# Patient Record
Sex: Female | Born: 1955
Health system: Southern US, Community
[De-identification: ages and names within clinical notes are randomized; demographics above are authoritative.]

## PROBLEM LIST (undated history)

## (undated) DIAGNOSIS — K635 Polyp of colon: Secondary | ICD-10-CM

## (undated) DIAGNOSIS — E785 Hyperlipidemia, unspecified: Secondary | ICD-10-CM

## (undated) DIAGNOSIS — M1990 Unspecified osteoarthritis, unspecified site: Secondary | ICD-10-CM

## (undated) DIAGNOSIS — E559 Vitamin D deficiency, unspecified: Secondary | ICD-10-CM

## (undated) DIAGNOSIS — H409 Unspecified glaucoma: Secondary | ICD-10-CM

## (undated) DIAGNOSIS — Z85820 Personal history of malignant melanoma of skin: Secondary | ICD-10-CM

## (undated) DIAGNOSIS — F419 Anxiety disorder, unspecified: Secondary | ICD-10-CM

## (undated) HISTORY — PX: GALLBLADDER SURGERY: SHX652

## (undated) HISTORY — DX: Personal history of malignant melanoma of skin: Z85.820

## (undated) HISTORY — DX: Anxiety disorder, unspecified: F41.9

## (undated) HISTORY — PX: PARTIAL HYSTERECTOMY: SHX80

## (undated) HISTORY — DX: Hyperlipidemia, unspecified: E78.5

## (undated) HISTORY — PX: APPENDECTOMY: SHX54

## (undated) HISTORY — PX: SKIN CANCER EXCISION: SHX779

## (undated) HISTORY — PX: HERNIA REPAIR: SHX51

## (undated) HISTORY — DX: Polyp of colon: K63.5

## (undated) HISTORY — DX: Vitamin D deficiency, unspecified: E55.9

## (undated) HISTORY — DX: Unspecified glaucoma: H40.9

## (undated) HISTORY — PX: CHOLECYSTECTOMY: SHX55

---

## 1998-08-20 ENCOUNTER — Other Ambulatory Visit: Admission: RE | Admit: 1998-08-20 | Discharge: 1998-08-20 | Payer: Self-pay | Admitting: Obstetrics & Gynecology

## 2000-10-25 ENCOUNTER — Other Ambulatory Visit: Admission: RE | Admit: 2000-10-25 | Discharge: 2000-10-25 | Payer: Self-pay | Admitting: Obstetrics & Gynecology

## 2001-02-22 ENCOUNTER — Emergency Department (HOSPITAL_COMMUNITY): Admission: EM | Admit: 2001-02-22 | Discharge: 2001-02-22 | Payer: Self-pay | Admitting: Emergency Medicine

## 2001-11-22 ENCOUNTER — Other Ambulatory Visit: Admission: RE | Admit: 2001-11-22 | Discharge: 2001-11-22 | Payer: Self-pay | Admitting: Obstetrics & Gynecology

## 2002-12-08 ENCOUNTER — Other Ambulatory Visit: Admission: RE | Admit: 2002-12-08 | Discharge: 2002-12-08 | Payer: Self-pay | Admitting: Obstetrics & Gynecology

## 2004-01-21 ENCOUNTER — Other Ambulatory Visit: Admission: RE | Admit: 2004-01-21 | Discharge: 2004-01-21 | Payer: Self-pay | Admitting: Obstetrics & Gynecology

## 2005-02-23 ENCOUNTER — Other Ambulatory Visit: Admission: RE | Admit: 2005-02-23 | Discharge: 2005-02-23 | Payer: Self-pay | Admitting: Obstetrics & Gynecology

## 2005-03-16 ENCOUNTER — Ambulatory Visit: Payer: Self-pay | Admitting: Gastroenterology

## 2005-03-23 ENCOUNTER — Ambulatory Visit: Payer: Self-pay | Admitting: Gastroenterology

## 2005-04-27 ENCOUNTER — Ambulatory Visit: Payer: Self-pay | Admitting: Gastroenterology

## 2009-08-05 ENCOUNTER — Encounter (INDEPENDENT_AMBULATORY_CARE_PROVIDER_SITE_OTHER): Payer: Self-pay | Admitting: *Deleted

## 2009-09-27 ENCOUNTER — Ambulatory Visit: Payer: Self-pay | Admitting: Gastroenterology

## 2009-09-27 DIAGNOSIS — Z860100 Personal history of colon polyps, unspecified: Secondary | ICD-10-CM | POA: Insufficient documentation

## 2009-09-27 DIAGNOSIS — H402290 Chronic angle-closure glaucoma, unspecified eye, stage unspecified: Secondary | ICD-10-CM | POA: Insufficient documentation

## 2009-09-27 DIAGNOSIS — K625 Hemorrhage of anus and rectum: Secondary | ICD-10-CM | POA: Insufficient documentation

## 2010-02-03 NOTE — Letter (Signed)
Summary: New Patient letter  [facility]  [address] [place], [place] [address]   Phone: [phone]  Fax: [phone]       08/05/2009 MRN: [id number]  [facility] [address] [place], [place]  [address]  Dear Ms. Lutzke,  Welcome to the Gastroenterology Division at [facility].    You are scheduled to see Dr. Arlyce Dice on 09/27/2009 at 9:00AM on the 3rd floor at [facility], [address]  We ask that you try to arrive at our office 15 minutes prior to your appointment time to allow for check-in.  We would like you to complete the enclosed self-administered evaluation form prior to your visit and bring it with you on the day of your appointment.  We will review it with you.  Also, please bring a complete list of all your medications or, if you prefer, bring the medication bottles and we will list them.  Please bring your insurance card so that we may make a copy of it.  If your insurance requires a referral to see a specialist, please bring your referral form from your primary care physician.  Co-payments are due at the time of your visit and may be paid by cash, check or credit card.     Your office visit will consist of a consult with your physician (includes a physical exam), any laboratory testing he/she may order, scheduling of any necessary diagnostic testing (e.g. x-ray, ultrasound, CT-scan), and scheduling of a procedure (e.g. Endoscopy, Colonoscopy) if required.  Please allow enough time on your schedule to allow for any/all of these possibilities.    If you cannot keep your appointment, please call [phone] to cancel or reschedule prior to your appointment date.  This allows [place] the opportunity to schedule an appointment for another patient in need of care.  If you do not cancel or reschedule by 5 p.m. the business day prior to your appointment date, you will be charged a $50.00 late cancellation/no-show fee.    Thank you for choosing  [facility] for your medical needs.  We appreciate the opportunity to care for you.  Please visit [place] at our website  to learn more about our practice.                     Sincerely,                                                             The Gastroenterology Division

## 2010-02-03 NOTE — Procedures (Signed)
Summary: Colonoscopy   Colonoscopy  Procedure date:  03/23/2005  Findings:      Results: Polyp.  Results: Hemorrhoids.     Location:  [facility].    Comments:      Repeat colonoscopy in 5 years.   Colonoscopy  Procedure date:  03/23/2005  Findings:      Results: Polyp.  Results: Hemorrhoids.     Location:  [facility].    Comments:      Repeat colonoscopy in 5 years.  Patient Name: Angie Fisher, Angie Fisher. MRN:  Procedure Procedures: Colonoscopy CPT: 7801474117.    with polypectomy. CPT: A3573898.  Personnel: Endoscopist: Barbette Hair. Arlyce Dice, MD.  Patient Consent: Procedure, Alternatives, Risks and Benefits discussed, consent obtained, from patient.  Indications Symptoms: Abdominal pain / bloating.  History  Current Medications: Patient is not currently taking Coumadin.  Pre-Exam Physical: Performed Mar 23, 2005. Cardio-pulmonary exam, HEENT exam , Abdominal exam, Mental status exam WNL.  Comments: Patient history reviewed/updated, physical performed prior to initiation of sedation? Exam Exam: Extent of exam reached: Cecum, extent intended: Cecum.  The cecum was identified by IC valve. Colon retroflexion performed. ASA Classification: I. Tolerance: good.  Monitoring: Pulse and BP monitoring, Oximetry used. Supplemental O2 given. at 2 Liters.  Colon Prep Used Miralax for colon prep. Prep results: good.  Sedation Meds: Patient assessed and found to be appropriate for moderate (conscious) sedation. Sedation was managed by the Endoscopist. Fentanyl 100 mcg. given IV. Versed 12 given IV.  Findings - NORMAL EXAM: Cecum to Rectum.  NORMAL EXAM: Cecum.  POLYP: Cecum, Maximum size: 15 mm. sessile polyp. Procedure:  snare with cautery/saline, Polyp sent to pathology. ICD9: Colon Polyps: 211.3.  HEMORRHOIDS: Internal. ICD9: Hemorrhoids, Internal: 455.0.   Assessment Abnormal examination, see findings above.  Diagnoses: 211.3: Colon Polyps.    455.0: Hemorrhoids, Internal.   Events  Unplanned Interventions: No intervention was required.  Unplanned Events: There were no complications. Plans  Post Exam Instructions: Post sedation instructions given.  Medication Plan: Fiber supplements: Bran 1 Tbsp QD, starting Mar 23, 2005   Patient Education: Patient given standard instructions for: Polyps. Hemorrhoids.  Disposition: After procedure patient sent to recovery. After recovery patient sent home.  Scheduling/Referral: Office Visit, to [employer]. Arlyce Dice, MD, around Apr 23, 2005.  Colonoscopy, to Barbette Hair. Arlyce Dice, MD, around Mar 24, 2010.    This report was created from the original endoscopy report, which was reviewed and signed by the above listed endoscopist.

## 2010-02-03 NOTE — Assessment & Plan Note (Signed)
Summary: RECTAL BLEEDING...AS.   History of Present Illness Visit Type: consult Primary GI MD: Melvia Heaps MD [facility] Primary Rykar Lebleu: Catha Gosselin, MD  Requesting Burgundy Matuszak: Freddy Finner, MD  Chief Complaint: Rectal pain, hemorrhoids, and rectal bleeding History of Present Illness:   [name] is a pleasant 55 year old white female referred at the request of Dr. Jennette Kettle for evaluation of bleeding.  She has a history of colon polyps and hemorrhoids seen at colonoscopy in 2008.  Several weeks ago she had a week of painless bright red blood per rectum.  She occasionally developes spontaneous rectal pain.  She complains of mild constipation.   GI Review of Systems      Denies abdominal pain, acid reflux, belching, bloating, chest pain, dysphagia with liquids, dysphagia with solids, heartburn, loss of appetite, nausea, vomiting, vomiting blood, weight loss, and  weight gain.      Reports hemorrhoids, rectal bleeding, and  rectal pain.     Denies anal fissure, black tarry stools, change in bowel habit, constipation, diarrhea, diverticulosis, fecal incontinence, heme positive stool, irritable bowel syndrome, jaundice, light color stool, and  liver problems.    Current Medications (verified): 1)  Estradiol 2 Mg Tabs (Estradiol) .... As Directed 2)  Simvastatin 40 Mg Tabs (Simvastatin) .... One Tablet By Mouth Once Daily 3)  Lumigan 0.03 % Soln (Bimatoprost) .... As Directed 4)  Preparation H Hydrocortisone 1 % Crea (Hydrocortisone) .... As Needed  Allergies (verified): 1)  ! 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  Alcohol Use - no Daily Caffeine Use: one daily  Illicit Drug Use - no Smoking Status:  quit Drug Use:  no  Review of Systems       The patient complains of muscle pains/cramps.  The patient denies allergy/sinus, anemia, anxiety-new, arthritis/joint pain, back pain, blood in urine, breast changes/lumps, change in vision, confusion, cough, coughing up blood, depression-new, fainting, fatigue, fever, headaches-new, hearing problems, heart murmur, heart rhythm changes, itching, menstrual pain, night sweats, nosebleeds, pregnancy symptoms, shortness of breath, skin rash, sleeping problems, sore throat, swelling of feet/legs, swollen lymph glands, thirst - excessive , urination - excessive , urination changes/pain, urine leakage, vision changes, and voice change.         All other systems were reviewed and were negative   Vital Signs:  Patient profile:   55 year old female Height:      64 inches Weight:      146 pounds BMI:     25.15 BSA:     1.71 Pulse rate:   76 / minute BP sitting:   126 / 82  (left arm) Cuff size:   regular  Vitals Entered By: Ok Anis CMA (September 27, 2009 9:05 AM)  Physical Exam  Additional Exam:  O physical exam she is a well-developed well-nourished female  skin: anicteric HEENT: normocephalic; PEERLA; no nasal or pharyngeal abnormalities neck: supple nodes: no cervical lymphadenopathy chest: clear to ausculatation and percussion heart: no murmurs, gallops, or rubs abd: soft, nontender; BS normoactive; no abdominal masses, tenderness, organomegaly rectal: no masses ext: no cynanosis, clubbing, edema skeletal: no deformities neuro: oriented x 3; no focal abnormalities    Impression &  Recommendations:  Problem # 1:  RECTAL BLEEDING (ICD-569.3) This most likely is due to hemorrhoidal bleeding.  Recommendations #1 Anusol HC suppositories  Problem # 2:  PERSONAL HISTORY OF COLONIC POLYPS (ICD-V12.72) Plan followup colonoscopy in  March, 2012  Patient Instructions: 1)  Copy sent to : Catha Gosselin, MD -Freddy Finner, MD  2)  The medication list was reviewed and reconciled.  All changed / newly prescribed medications were explained.  A complete medication list was provided to the patient / caregiver. Prescriptions: ANUSOL-HC 25 MG SUPP (HYDROCORTISONE ACETATE) take one suppository q.h.s.  #7 x 1   Entered and Authorized by:   Louis Meckel MD   Signed by:   Louis Meckel MD on 09/27/2009   Method used:   [facility]        [facility] [address] [phone]* (retail)       [address]       [place], [place]  [address]       Ph: [phone] or [phone]       Fax: [phone]   RxID:   [id number]

## 2010-03-10 ENCOUNTER — Encounter (INDEPENDENT_AMBULATORY_CARE_PROVIDER_SITE_OTHER): Payer: Self-pay | Admitting: *Deleted

## 2010-03-11 ENCOUNTER — Encounter: Payer: Self-pay | Admitting: Gastroenterology

## 2010-03-15 NOTE — Miscellaneous (Signed)
Summary: [facility] Previsit/prep  Clinical Lists Changes  Medications: Added new medication of MOVIPREP 100 GM  SOLR (PEG-KCL-NACL-NASULF-NA ASC-C) As per prep instructions. - Signed Rx of MOVIPREP 100 GM  SOLR (PEG-KCL-NACL-NASULF-NA ASC-C) As per prep instructions.;  #1 x 0;  Signed;  Entered by: Wyona Almas RN;  Authorized by: Louis Meckel MD;  Method used: [facility] [address] [phone]*, [address] [place], [place], [place]  [address], Ph: [phone] or [phone], Fax: [phone] Allergies: Changed allergy or adverse reaction from SULFA to SULFA    Prescriptions: MOVIPREP 100 GM  SOLR (PEG-KCL-NACL-NASULF-NA ASC-C) As per prep instructions.  #1 x 0   Entered by:   Wyona Almas RN   Authorized by:   Louis Meckel MD   Signed by:   Wyona Almas RN on 03/11/2010   Method used:   [facility]ron[facility]        [facility] [address] [phone]* (retail)       [address]       [place], [place]  [address]       Ph: [phone] or [phone]       Fax: [phone]   RxID:   [id number]

## 2010-03-15 NOTE — Letter (Signed)
Summary: [facility] Instructions  [facility]  [address] [place], [place] [address]   Phone: [phone]  Fax: [phone]       Angie Fisher    [birth year]    MRN: [id number]        Procedure Day Dorna Bloom:  Farrell Ours  03/25/10     Arrival Time:  7:30AM     Procedure Time:  8:30AM     Location of Procedure:                    Juliann Pares _  [facility] (4th Floor)                      PREPARATION FOR COLONOSCOPY WITH MOVIPREP   Starting 5 days prior to your procedure 03/20/10 do not eat nuts, seeds, popcorn, corn, beans, peas,  salads, or any raw vegetables.  Do not take any fiber supplements (e.g. Metamucil, Citrucel, and Benefiber).  THE DAY BEFORE YOUR PROCEDURE         DATE: 03/24/10  DAY: THURSDAY  1.  Drink clear liquids the entire day-NO SOLID FOOD  2.  Do not drink anything colored red or purple.  Avoid juices with pulp.  No orange juice.  3.  Drink at least 64 oz. (8 glasses) of fluid/clear liquids during the day to prevent dehydration and help the prep work efficiently.  CLEAR LIQUIDS INCLUDE: Water Jello Ice Popsicles Tea (sugar ok, no milk/cream) Powdered fruit flavored drinks Coffee (sugar ok, no milk/cream) Gatorade Juice: apple, white grape, white cranberry  Lemonade Clear bullion, consomm, broth Carbonated beverages (any kind) Strained chicken noodle soup Hard Candy                             4.  In the morning, mix first dose of MoviPrep solution:    Empty 1 Pouch A and 1 Pouch B into the disposable container    Add lukewarm drinking water to the top line of the container. Mix to dissolve    Refrigerate (mixed solution should be used within 24 hrs)  5.  Begin drinking the prep at 5:00 p.m. The MoviPrep container is divided by 4 marks.   Every 15 minutes drink the solution down to the next mark (approximately 8 oz) until the full liter is complete.   6.  Follow completed prep with 16 oz of clear liquid of your choice (Nothing red  or purple).  Continue to drink clear liquids until bedtime.  7.  Before going to bed, mix second dose of MoviPrep solution:    Empty 1 Pouch A and 1 Pouch B into the disposable container    Add lukewarm drinking water to the top line of the container. Mix to dissolve    Refrigerate  THE DAY OF YOUR PROCEDURE      DATE: 03/25/10   DAY: FRIDAY  Beginning at 3:30AM (5 hours before procedure):         1. Every 15 minutes, drink the solution down to the next mark (approx 8 oz) until the full liter is complete.  2. Follow completed prep with 16 oz. of clear liquid of your choice.    3. You may drink clear liquids until 6:30AM (2 HOURS BEFORE PROCEDURE).   MEDICATION INSTRUCTIONS  Unless otherwise instructed, you should take regular prescription medications with a small sip of water   as early as possible the morning of  your procedure.        OTHER INSTRUCTIONS  You will need a responsible adult at least 53 years of age to accompany you and drive you home.   This person must remain in the waiting room during your procedure.  Wear loose fitting clothing that is easily removed.  Leave jewelry and other valuables at home.  However, you may wish to bring a book to read or  an iPod/MP3 player to listen to music as you wait for your procedure to start.  Remove all body piercing jewelry and leave at home.  Total time from sign-in until discharge is approximately 2-3 hours.  You should go home directly after your procedure and rest.  You can resume normal activities the  day after your procedure.  The day of your procedure you should not:   Drive   Make legal decisions   Operate machinery   Drink alcohol   Return to work  You will receive specific instructions about eating, activities and medications before you leave.    The above instructions have been reviewed and explained to me by   Wyona Almas RN  March 11, 2010 4:33 PM     I fully understand and can  verbalize these instructions _____________________________ Date _________

## 2010-03-24 ENCOUNTER — Encounter: Payer: Self-pay | Admitting: *Deleted

## 2010-03-25 ENCOUNTER — Encounter: Payer: Self-pay | Admitting: Gastroenterology

## 2010-03-25 ENCOUNTER — Ambulatory Visit (AMBULATORY_SURGERY_CENTER): Payer: 59 | Admitting: Gastroenterology

## 2010-03-25 VITALS — BP 105/75 | HR 71 | Temp 97.2°F | Resp 16 | Ht 64.0 in | Wt 147.0 lb

## 2010-03-25 DIAGNOSIS — Z1211 Encounter for screening for malignant neoplasm of colon: Secondary | ICD-10-CM

## 2010-03-25 DIAGNOSIS — Z86010 Personal history of colonic polyps: Secondary | ICD-10-CM

## 2010-03-25 DIAGNOSIS — D126 Benign neoplasm of colon, unspecified: Secondary | ICD-10-CM

## 2010-03-25 NOTE — Patient Instructions (Signed)
[employer] reviewed and signed by care partner. Polyp information sheet given to care partner.  Impressions/Recommendations: Polyp.  Repeat colonoscopy in 5-10 years pending pathology reports.

## 2010-03-28 ENCOUNTER — Telehealth: Payer: Self-pay | Admitting: *Deleted

## 2010-03-28 NOTE — Telephone Encounter (Signed)
Left message with patient

## 2010-03-29 ENCOUNTER — Encounter: Payer: Self-pay | Admitting: Gastroenterology

## 2010-03-31 NOTE — Procedures (Signed)
Summary: Colonoscopy  Patient: Angie Fisher Note: All result statuses are Final unless otherwise noted.  Tests: (1) Colonoscopy (COL)   COL Colonoscopy           DONE     [facility]     [address]     [place], [place]  [address]          COLONOSCOPY PROCEDURE REPORT          PATIENT:  Shivali, Quackenbush  MR#:  [id number]     BIRTHDATE:  1955-02-17, 54 yrs. old  GENDER:  female          ENDOSCOPIST:  Barbette Hair. Arlyce Dice, MD     Referred by:  Catha Gosselin, M.D.          PROCEDURE DATE:  03/25/2010     PROCEDURE:  Colonoscopy with snare polypectomy     ASA CLASS:  Class I     INDICATIONS:  1) screening  2) history of pre-cancerous     (adenomatous) colon polyps index polypectomy 2006          MEDICATIONS:   Fentanyl 100 mcg IV, Versed 10 mg IM, Benadryl 25     mg IV          DESCRIPTION OF PROCEDURE:   After the risks benefits and     alternatives of the procedure were thoroughly explained, informed     consent was obtained.  Digital rectal exam was performed and     revealed no abnormalities.   The LB [id number] endoscope     was introduced through the anus and advanced to the cecum, which     was identified by the ileocecal valve, without limitations.  The     quality of the prep was excellent, using MoviPrep.  The instrument     was then slowly withdrawn as the colon was fully examined.     <<PROCEDUREIMAGES>>          FINDINGS:  A sessile polyp was found in the ascending colon. It     was 12 mm in size. Polyp was snared without cautery. Retrieval was     successful (see image3). snare polyp  This was otherwise a normal     examination of the colon (see image4 and image6).   Retroflexed     views in the rectum revealed no abnormalities.    The time to     cecum =  5.25  minutes. The scope was then withdrawn (time =  6.0     min) from the patient and the procedure completed.          COMPLICATIONS:  None          ENDOSCOPIC IMPRESSION:     1) 12 mm  sessile polyp in the ascending colon     2) Otherwise normal examination     RECOMMENDATIONS:     1) If the polyp(s) removed today are proven to be adenomatous     (pre-cancerous) polyps, you will need a repeat colonoscopy in 5     years. Otherwise you should continue to follow colorectal cancer     screening guidelines for "routine risk" patients with colonoscopy     in 10 years.     2) Sedation with MAC for future procedures          REPEAT EXAM:   You will receive a letter from Dr. Arlyce Dice in 1-2     weeks, after reviewing the final  pathology, with followup     recommendations.          ______________________________     Barbette Hair Arlyce Dice, MD          CC:          n.     eSIGNED:   Barbette Hair. Trevon Strothers at 03/25/2010 08:48 AM          [place], Olegario Messier, [id number]  Note: An exclamation mark (!) indicates a result that was not dispersed into the flowsheet. Document Creation Date: 03/25/2010 8:48 AM _______________________________________________________________________  (1) Order result status: Final Collection or observation date-time: 03/25/2010 08:39 Requested date-time:  Receipt date-time:  Reported date-time:  Referring Physician:   Ordering Physician: Melvia Heaps [phone]) Specimen Source:  Source: Launa Grill Order Number: [id number] Lab site:

## 2011-08-24 ENCOUNTER — Other Ambulatory Visit: Payer: Self-pay | Admitting: Dermatology

## 2012-09-12 ENCOUNTER — Other Ambulatory Visit: Payer: Self-pay | Admitting: Dermatology

## 2013-09-27 ENCOUNTER — Encounter: Payer: Self-pay | Admitting: Gastroenterology

## 2014-01-05 ENCOUNTER — Other Ambulatory Visit: Payer: Self-pay | Admitting: Obstetrics & Gynecology

## 2014-01-06 LAB — CYTOLOGY - PAP

## 2015-03-19 ENCOUNTER — Encounter: Payer: Self-pay | Admitting: Gastroenterology

## 2016-10-31 ENCOUNTER — Other Ambulatory Visit: Payer: Self-pay | Admitting: Family Medicine

## 2016-10-31 DIAGNOSIS — M5416 Radiculopathy, lumbar region: Secondary | ICD-10-CM

## 2016-11-09 ENCOUNTER — Ambulatory Visit
Admission: RE | Admit: 2016-11-09 | Discharge: 2016-11-09 | Disposition: A | Discharge status: Home / Self Care | Payer: 59 | Source: Ambulatory Visit | Attending: Family Medicine | Admitting: Family Medicine

## 2016-11-09 DIAGNOSIS — M5416 Radiculopathy, lumbar region: Secondary | ICD-10-CM

## 2016-11-17 ENCOUNTER — Other Ambulatory Visit: Payer: Self-pay | Admitting: Family Medicine

## 2016-11-17 DIAGNOSIS — R5381 Other malaise: Secondary | ICD-10-CM

## 2016-11-27 ENCOUNTER — Other Ambulatory Visit: Payer: Self-pay | Admitting: Family Medicine

## 2016-11-27 DIAGNOSIS — R102 Pelvic and perineal pain: Secondary | ICD-10-CM

## 2016-11-27 DIAGNOSIS — M549 Dorsalgia, unspecified: Secondary | ICD-10-CM

## 2016-11-27 DIAGNOSIS — R109 Unspecified abdominal pain: Secondary | ICD-10-CM

## 2016-11-30 ENCOUNTER — Ambulatory Visit
Admission: RE | Admit: 2016-11-30 | Discharge: 2016-11-30 | Disposition: A | Discharge status: Home / Self Care | Payer: 59 | Source: Ambulatory Visit | Attending: Family Medicine | Admitting: Family Medicine

## 2016-11-30 DIAGNOSIS — R102 Pelvic and perineal pain: Secondary | ICD-10-CM

## 2018-05-24 ENCOUNTER — Other Ambulatory Visit: Payer: Self-pay | Admitting: Family Medicine

## 2018-05-24 DIAGNOSIS — M7989 Other specified soft tissue disorders: Secondary | ICD-10-CM

## 2018-05-30 ENCOUNTER — Other Ambulatory Visit: Payer: Self-pay

## 2018-05-30 ENCOUNTER — Ambulatory Visit
Admission: RE | Admit: 2018-05-30 | Discharge: 2018-05-30 | Disposition: A | Discharge status: Home / Self Care | Payer: 59 | Source: Ambulatory Visit | Attending: Family Medicine | Admitting: Family Medicine

## 2018-05-30 DIAGNOSIS — M7989 Other specified soft tissue disorders: Secondary | ICD-10-CM

## 2018-08-27 ENCOUNTER — Other Ambulatory Visit: Payer: Self-pay

## 2018-08-27 DIAGNOSIS — I872 Venous insufficiency (chronic) (peripheral): Secondary | ICD-10-CM

## 2018-08-29 ENCOUNTER — Ambulatory Visit (INDEPENDENT_AMBULATORY_CARE_PROVIDER_SITE_OTHER): Payer: 59 | Admitting: Vascular Surgery

## 2018-08-29 ENCOUNTER — Ambulatory Visit (HOSPITAL_COMMUNITY)
Admission: RE | Admit: 2018-08-29 | Discharge: 2018-08-29 | Disposition: A | Discharge status: Home / Self Care | Payer: 59 | Source: Ambulatory Visit | Attending: Family | Admitting: Family

## 2018-08-29 ENCOUNTER — Other Ambulatory Visit: Payer: Self-pay

## 2018-08-29 ENCOUNTER — Encounter: Payer: Self-pay | Admitting: Vascular Surgery

## 2018-08-29 VITALS — BP 133/78 | HR 76 | Temp 97.9°F | Resp 18 | Ht 64.0 in | Wt 153.8 lb

## 2018-08-29 DIAGNOSIS — I872 Venous insufficiency (chronic) (peripheral): Secondary | ICD-10-CM | POA: Diagnosis not present

## 2018-08-29 DIAGNOSIS — R600 Localized edema: Secondary | ICD-10-CM

## 2018-08-29 NOTE — Progress Notes (Signed)
Referring Physician: Dr Hulan Fess  Patient name: Angie Fisher MRN: [id number] DOB: April 28, 1955 Sex: female  REASON FOR CONSULT: Right ankle and foot swelling  HPI: Angie Fisher is a 63 y.o. female, with a several month history of right foot and ankle swelling.  The patient previously had a skin cancer removed from her right medial calf.  She states that recently she has had some pain and aching underneath the prior scar which radiates down into her foot.  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  That procedure was in 2014.  She has no family history of varicose veins.  She has no family history or personal history of DVT.  She states the leg swelling does improve with elevation.  She has been wearing some TED hose during the day which improved her symptoms a little bit but did not completely alleviate them.  She apparently had a conversation with her primary care physician Dr. Rex Kras recently who suggested may be going to a more compression.  She has no symptoms in the left leg.  Other medical problems include hyperlipidemia which has been stable.  Past Medical History:  Diagnosis Date  . Hyperlipidemia    Past Surgical History:  Procedure Laterality Date  . GALLBLADDER SURGERY    . PARTIAL HYSTERECTOMY    . SKIN CANCER EXCISION      History reviewed. No pertinent family history.  SOCIAL HISTORY: Social History   Socioeconomic History  . Marital status: Married    Spouse name: Not on file  . Number of children: Not on file  . Years of education: Not on file  . Highest education level: Not on file  Occupational History  . Not on file  Social Needs  . Financial resource strain: Not on file  . Food insecurity    Worry: Not on file    Inability: Not on file  . Transportation needs    Medical: Not on file    Non-medical: Not on file  Tobacco Use  . Smoking status: Former Research scientist (life sciences)  . Smokeless tobacco: Never Used  Substance and Sexual Activity  . Alcohol use: Yes   Alcohol/week: 3.0 standard drinks    Types: 3 Standard drinks or equivalent per week  . Drug use: Never  . Sexual activity: Not on file  Lifestyle  . Physical activity    Days per week: Not on file    Minutes per session: Not on file  . Stress: Not on file  Relationships  . Social Herbalist on phone: Not on file    Gets together: Not on file    Attends religious service: Not on file    Active member of club or organization: Not on file    Attends meetings of clubs or organizations: Not on file    Relationship status: Not on file  . Intimate partner violence    Fear of current or ex partner: Not on file    Emotionally abused: Not on file    Physically abused: Not on file    Forced sexual activity: Not on file  Other Topics Concern  . Not on file  Social History Narrative  . Not on file    Allergies  Allergen Reactions  . Lidocaine Hives  . Morphine Other (See Comments)    crazy  . Sulfonamide Derivatives     REACTION: Nausea/vomiting    Current Outpatient Medications  Medication Sig Dispense Refill  . ALPRAZolam (XANAX) 0.25 MG tablet 2 (  two) times daily as needed.     . Cholecalciferol (VITAMIN D3) 50 MCG (2000 UT) TABS Take 2,000 mcg by mouth daily.    Marland Kitchen estradiol (ESTRACE) 2 MG tablet Take 2 mg by mouth as directed.      . simvastatin (ZOCOR) 40 MG tablet Take 40 mg by mouth at bedtime.      . timolol (TIMOPTIC) 0.5 % ophthalmic solution     . triamterene-hydrochlorothiazide (DYAZIDE) 37.5-25 MG capsule triamterene 37.5 mg-hydrochlorothiazide 25 mg capsule    . valACYclovir (VALTREX) 1000 MG tablet TAKE 1/2 TABLET BY MOUTH TWICE A DAY FOR 5 DAYS FOR OUT BREAKS     No current facility-administered medications for this visit.     ROS:   General:  No weight loss, Fever, chills  HEENT: No recent headaches, no nasal bleeding, no visual changes, no sore throat  Neurologic: No dizziness, blackouts, seizures. No recent symptoms of stroke or mini- stroke.  No recent episodes of slurred speech, or temporary blindness.  Cardiac: No recent episodes of chest pain/pressure, no shortness of breath at rest.  No shortness of breath with exertion.  Denies history of atrial fibrillation or irregular heartbeat  Vascular: No history of rest pain in feet.  No history of claudication.  No history of non-healing ulcer, No history of DVT   Pulmonary: No home oxygen, no productive cough, no hemoptysis,  No asthma or wheezing  Musculoskeletal:  [ ]  Arthritis, [ ]  Low back pain,  [ ]  Joint pain  Hematologic:No history of hypercoagulable state.  No history of easy bleeding.  No history of anemia  Gastrointestinal: No hematochezia or melena,  No gastroesophageal reflux, no trouble swallowing  Urinary: [ ]  chronic Kidney disease, [ ]  on HD - [ ]  MWF or [ ]  TTHS, [ ]  Burning with urination, [ ]  Frequent urination, [ ]  Difficulty urinating;   Skin: No rashes  Psychological: No history of anxiety,  No history of depression   Physical Examination  Vitals:   08/29/18 1352  BP: 133/78  Pulse: 76  Resp: 18  Temp: 97.9 F (36.6 C)  SpO2: 97%  Weight: 153 lb 12.8 oz (69.8 kg)  Height: 5\' 4"  (1.626 m)    Body mass index is 26.4 kg/m.  General:  Alert and oriented, no acute distress HEENT: Normal Neck: No JVD Cardiac: Regular Rate and Rhythm  Abdomen: Soft, non-tender, non-distended, no palpable inguinal nodes Skin: No rash, no obvious large surface varicosities Extremity Pulses:  2+ radial, brachial, femoral, popliteal difficult to palpate dorsalis pedis, posterior tibial pulses bilaterally Musculoskeletal: No deformity trace ankle and pedal edema right foot  Neurologic: Upper and lower extremity motor 5/5 and symmetric  DATA:  Patient had a venous duplex for reflux today.  This should show some reflux in her right greater saphenous vein.  However the vein diameter was pretty small 2 mm in diameter or less throughout most of the leg.  There was some  reflux at the saphenofemoral junction.  There was no deep vein reflux.  ASSESSMENT: Patient with right ankle and foot swelling.  Although she has some superficial venous reflux her vein diameter is fairly small and not amenable to laser ablation at that diameter.  I agree with Dr. Rex Kras that she probably would benefit from compression stockings.  I also discussed with the patient that the duplex that we did today did not examine the iliac venous system and that if she has persistent symptoms and is not satisfied with improvement with  compression alone we could consider a vena caval iliac vein duplex for completeness to make sure that she does not have any iliac vein obstruction.   PLAN: She was fitted for lower extremity compression stocking for the right leg today.  If her symptoms or not improved or she is not satisfied she will return for a vena cava iliac vein duplex scan.  Otherwise she will follow-up on an as-needed basis.  As far as the reflux in her right greater saphenous is concerned.  This may eventually dilate over the next several years.  If it does we could certainly revisit whether or not the laser ablation would be in her interest.  I certainly would not consider redoing her duplex of her right leg unless she has renewed or worsening symptoms for at least several years.  Ruta Hinds, MD [facility] Office: [phone] Pager: [phone]

## 2019-03-13 IMAGING — MR MR LUMBAR SPINE W/O CM
4 of 5 series · 19 of 48 positions shown · non-contrast
Comparison: Lumbar spine radiographs 05/19/2015

CLINICAL DATA: Right low back pain.  Fall 3.5 weeks ago.

EXAM:
MRI LUMBAR SPINE WITHOUT CONTRAST
TECHNIQUE: Multiplanar, multisequence MR imaging of the lumbar spine was
performed. No intravenous contrast was administered.

[Series 6: T2 · sagittal · 4.0mm · 0.73mm/px · 6 of 15 slices shown (1 of 2)]
[im 1/15]
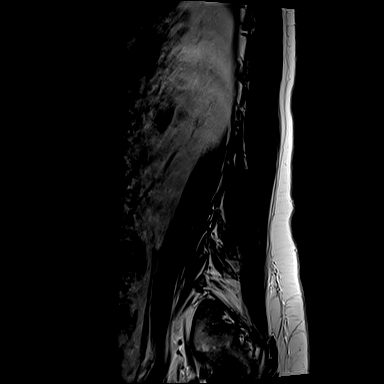
[im 3/15]
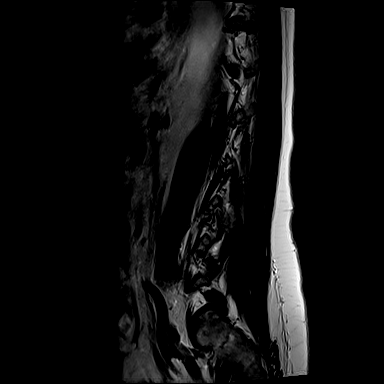
[im 6/15]
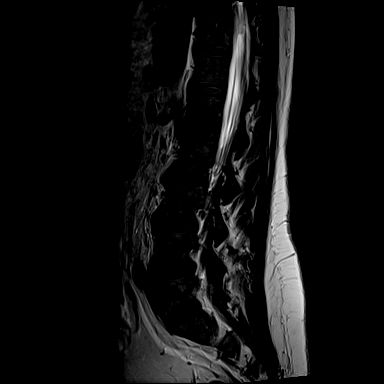
[im 9/15]
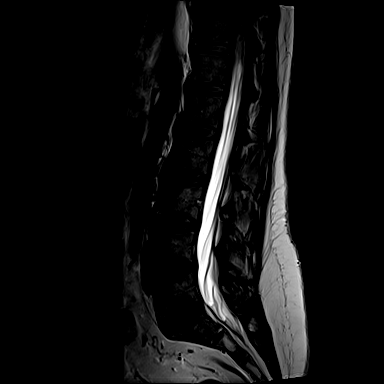
[im 12/15]
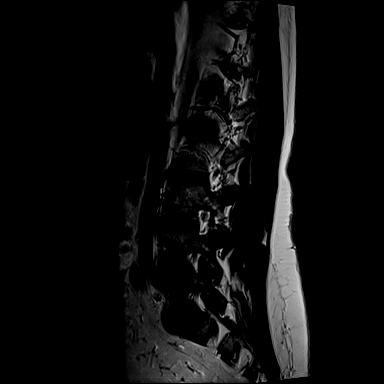
[im 15/15]
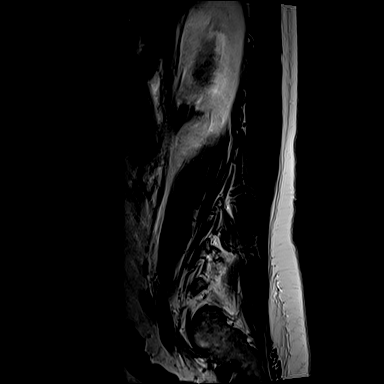

[Series 8: T1 · sagittal · 4.0mm · 0.73mm/px · 3 of 15 slices shown (1 of 2)]
[im 1/15]
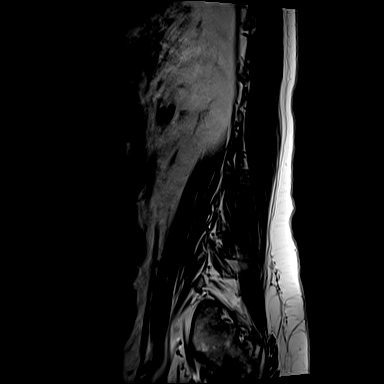
[im 8/15]
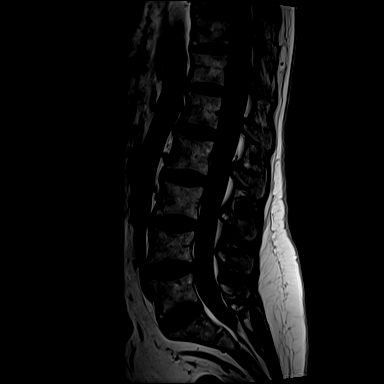
[im 15/15]
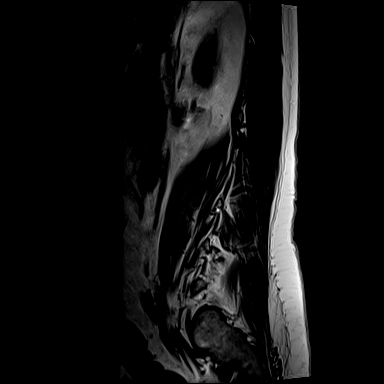

[Series 13: T2 · axial · 4.0mm · 0.28mm/px · z∈[-84,+108]mm · 7 of 48 slices shown (2 of 2)]
[im 4/48]
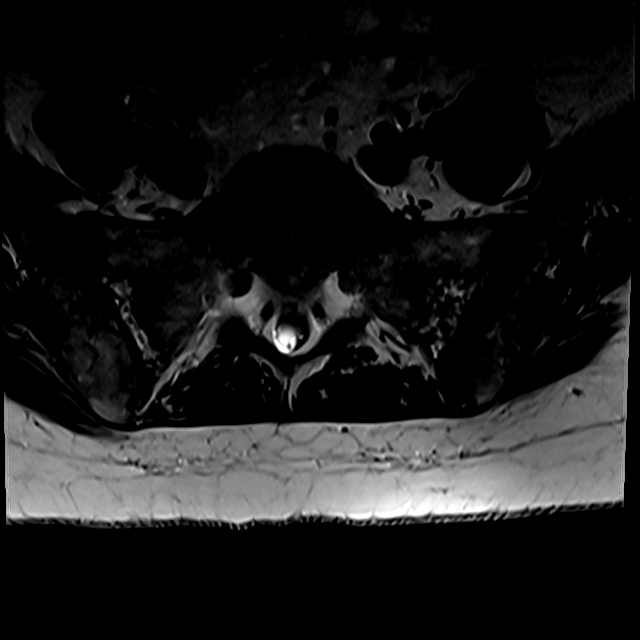
[im 7/48]
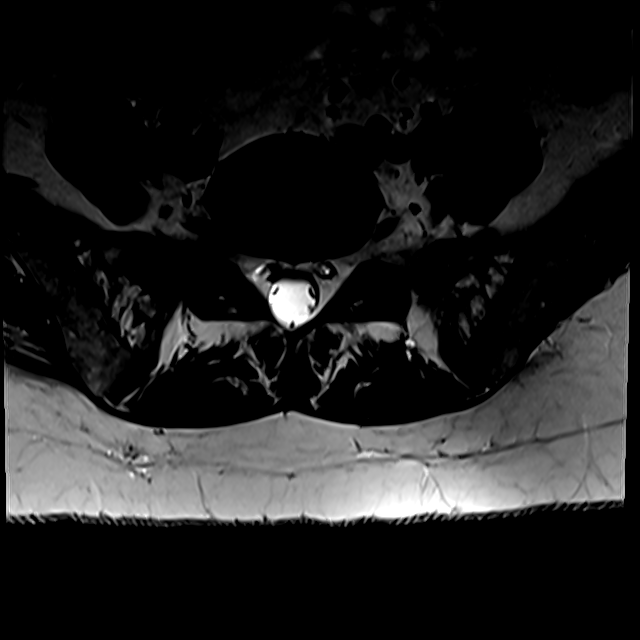
[im 10/48]
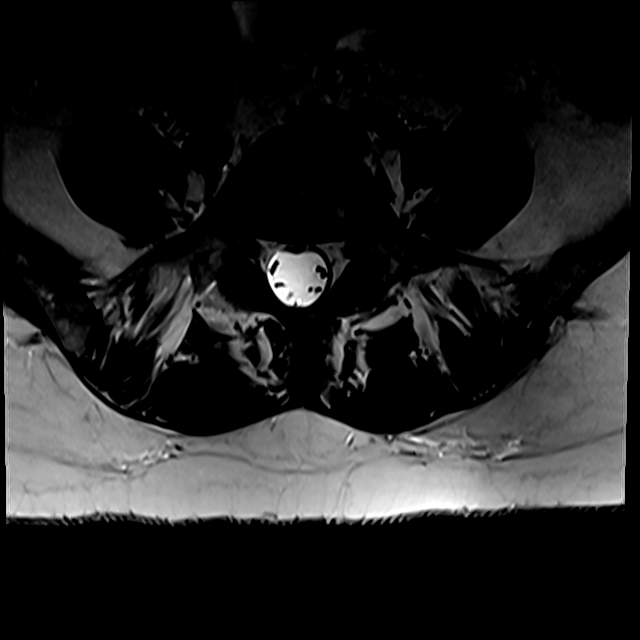
[im 16/48]
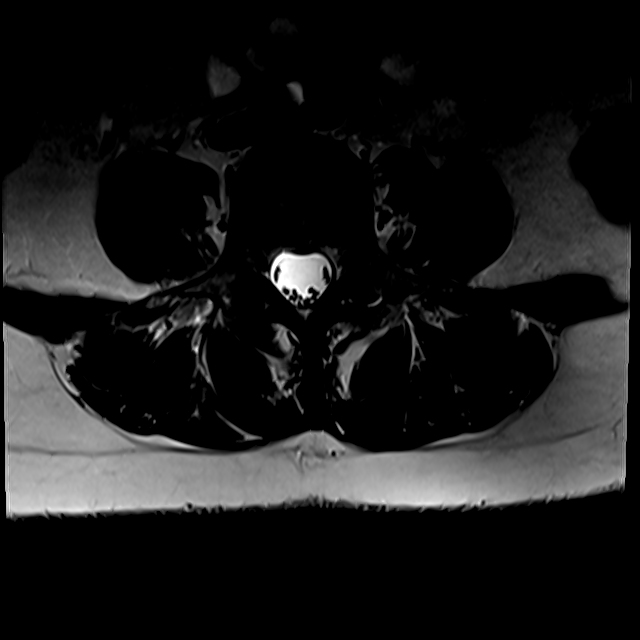
[im 22/48]
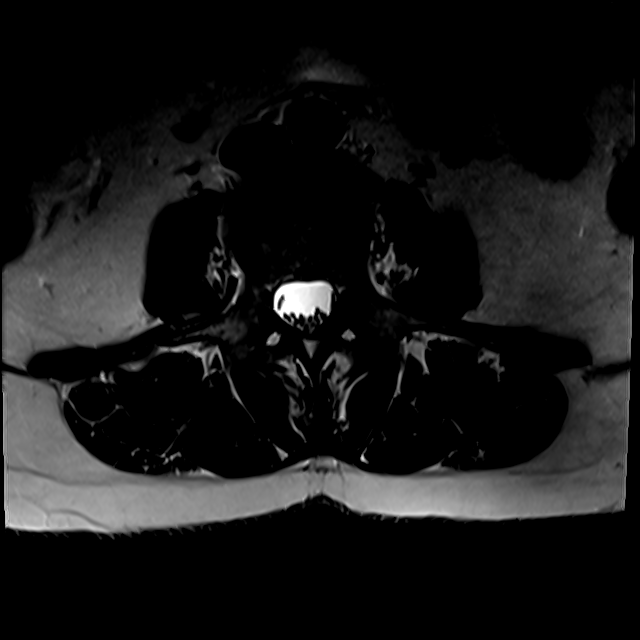
[im 26/48]
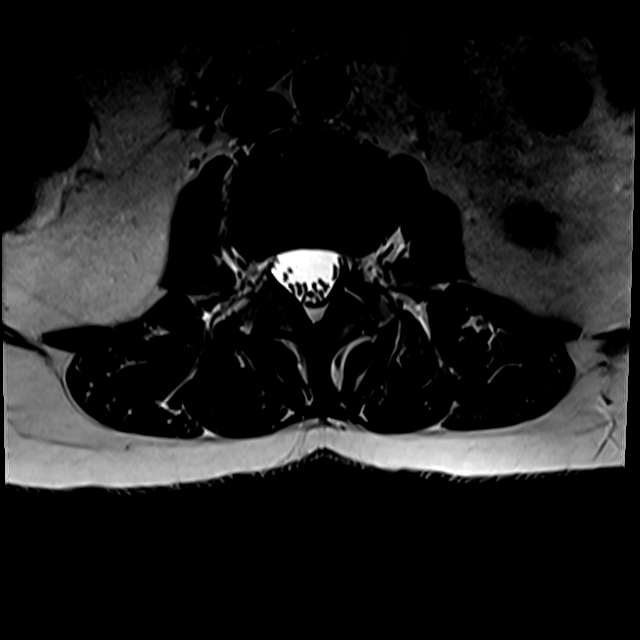
[im 41/48]
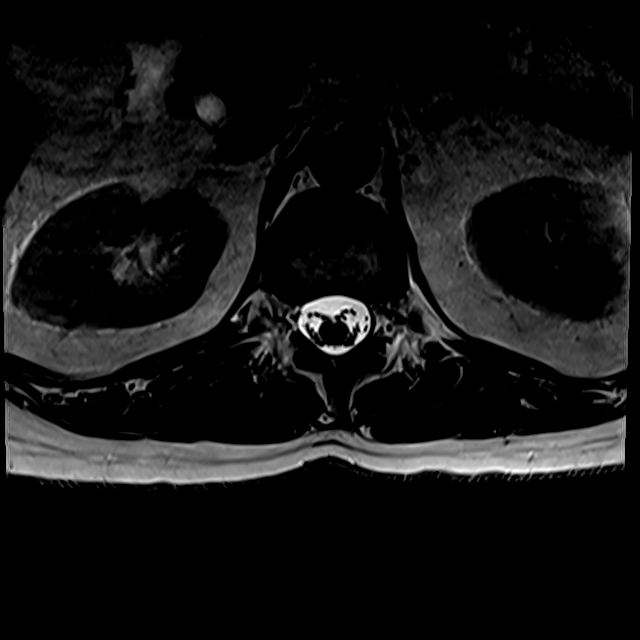

[Series 105: T1 · axial · 4.0mm · 0.28mm/px · z∈[-69,+108]mm · 3 of 48 slices shown (2 of 2)]
[im 7/48]
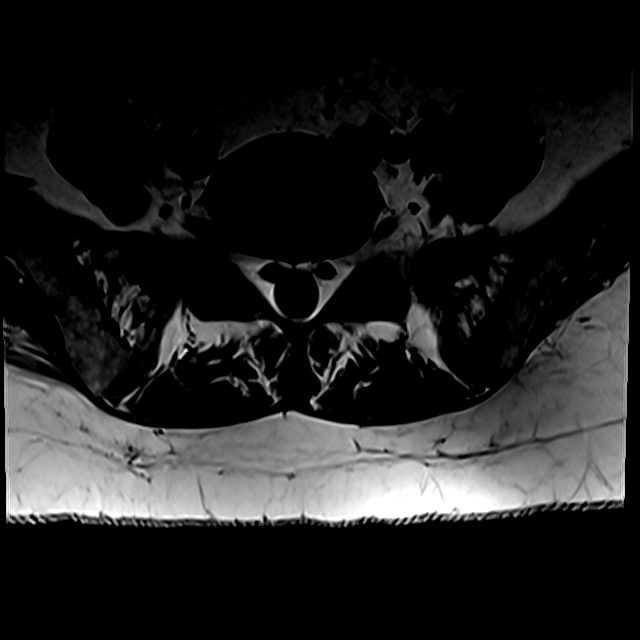
[im 26/48]
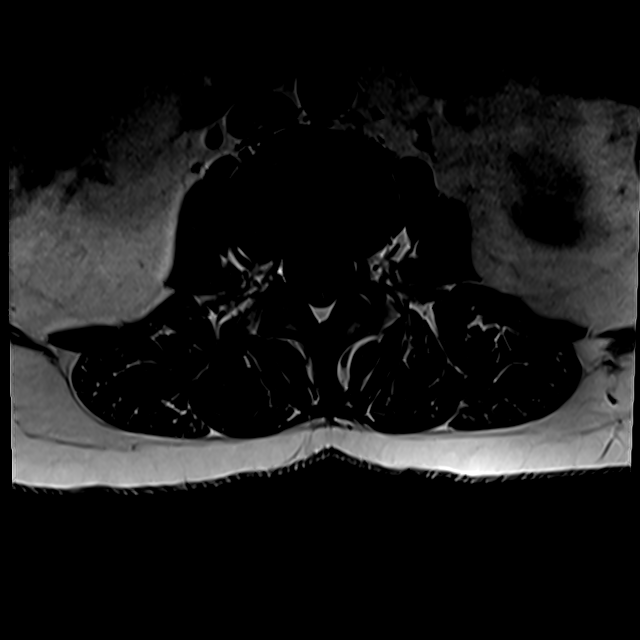
[im 41/48]
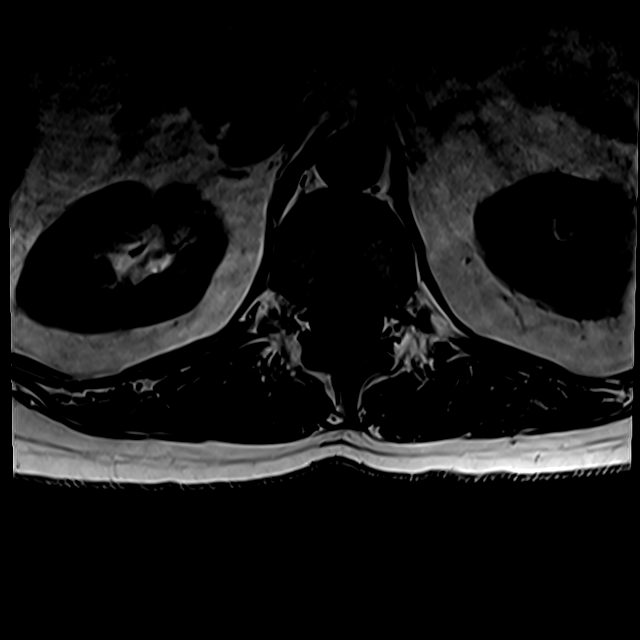

[19 of 48 positions shown; findings below may reference images not displayed]

FINDINGS: Segmentation:  Standard.

Alignment:  Normal.

Vertebrae: No fracture, suspicious osseous lesion, or significant
marrow edema.

Conus medullaris: Extends to the L1 level and appears normal.

Paraspinal and other soft tissues: Unremarkable.

Disc levels:

Disc space heights are preserved.

T11-12 through L3-4:  Negative.

L4-5: Disc desiccation. Mild disc bulging slightly greater to the
left without stenosis. Left foraminal annular fissure.

L5-S1:  Negative.
IMPRESSION: Mild disc bulging at L4-5.  No stenosis.

## 2019-04-11 ENCOUNTER — Other Ambulatory Visit: Payer: Self-pay | Admitting: Ophthalmology

## 2019-04-11 DIAGNOSIS — G453 Amaurosis fugax: Secondary | ICD-10-CM

## 2019-04-15 ENCOUNTER — Ambulatory Visit
Admission: RE | Admit: 2019-04-15 | Discharge: 2019-04-15 | Disposition: A | Discharge status: Home / Self Care | Payer: 59 | Source: Ambulatory Visit | Attending: Ophthalmology | Admitting: Ophthalmology

## 2019-04-15 DIAGNOSIS — G453 Amaurosis fugax: Secondary | ICD-10-CM

## 2019-05-27 ENCOUNTER — Other Ambulatory Visit (HOSPITAL_BASED_OUTPATIENT_CLINIC_OR_DEPARTMENT_OTHER): Payer: Self-pay | Admitting: Obstetrics & Gynecology

## 2019-06-13 ENCOUNTER — Other Ambulatory Visit (HOSPITAL_BASED_OUTPATIENT_CLINIC_OR_DEPARTMENT_OTHER): Payer: Self-pay | Admitting: Family Medicine

## 2019-10-10 ENCOUNTER — Other Ambulatory Visit (HOSPITAL_BASED_OUTPATIENT_CLINIC_OR_DEPARTMENT_OTHER): Payer: Self-pay | Admitting: Ophthalmology

## 2020-01-22 DIAGNOSIS — E559 Vitamin D deficiency, unspecified: Secondary | ICD-10-CM | POA: Diagnosis not present

## 2020-01-22 DIAGNOSIS — Z85820 Personal history of malignant melanoma of skin: Secondary | ICD-10-CM | POA: Diagnosis not present

## 2020-01-22 DIAGNOSIS — Z8669 Personal history of other diseases of the nervous system and sense organs: Secondary | ICD-10-CM | POA: Diagnosis not present

## 2020-01-22 DIAGNOSIS — I872 Venous insufficiency (chronic) (peripheral): Secondary | ICD-10-CM | POA: Diagnosis not present

## 2020-01-22 DIAGNOSIS — H409 Unspecified glaucoma: Secondary | ICD-10-CM | POA: Diagnosis not present

## 2020-01-22 DIAGNOSIS — E7800 Pure hypercholesterolemia, unspecified: Secondary | ICD-10-CM | POA: Diagnosis not present

## 2020-01-22 DIAGNOSIS — Z0000 Encounter for general adult medical examination without abnormal findings: Secondary | ICD-10-CM | POA: Diagnosis not present

## 2020-01-22 DIAGNOSIS — Z86010 Personal history of colonic polyps: Secondary | ICD-10-CM | POA: Diagnosis not present

## 2020-01-22 DIAGNOSIS — Z7689 Persons encountering health services in other specified circumstances: Secondary | ICD-10-CM | POA: Diagnosis not present

## 2020-01-26 ENCOUNTER — Other Ambulatory Visit (HOSPITAL_COMMUNITY): Payer: Self-pay | Admitting: Family Medicine

## 2020-01-26 DIAGNOSIS — I8290 Acute embolism and thrombosis of unspecified vein: Secondary | ICD-10-CM

## 2020-01-29 MED FILL — SIMVASTATIN 40 MG TABS: 40 | 90 days supply | Qty: 90 | Fill #0

## 2020-01-29 MED FILL — ESTRADIOL 2 MG TABS: 2 | 90 days supply | Qty: 90 | Fill #0

## 2020-01-29 MED FILL — TIMOLOL MALEATE 0.5 % SOLN: 0.5 | 75 days supply | Qty: 15 | Fill #0

## 2020-02-03 ENCOUNTER — Other Ambulatory Visit: Payer: Self-pay

## 2020-02-03 ENCOUNTER — Ambulatory Visit (HOSPITAL_COMMUNITY)
Admission: RE | Admit: 2020-02-03 | Discharge: 2020-02-03 | Disposition: A | Discharge status: Home / Self Care | Payer: 59 | Source: Ambulatory Visit | Attending: Family Medicine | Admitting: Family Medicine

## 2020-02-03 DIAGNOSIS — I8290 Acute embolism and thrombosis of unspecified vein: Secondary | ICD-10-CM | POA: Insufficient documentation

## 2020-02-03 DIAGNOSIS — E785 Hyperlipidemia, unspecified: Secondary | ICD-10-CM | POA: Insufficient documentation

## 2020-02-03 LAB — ECHOCARDIOGRAM COMPLETE
Area-P 1/2: 2.8 cm2
S' Lateral: 2.4 cm

## 2020-02-03 NOTE — Progress Notes (Signed)
  Echocardiogram 2D Echocardiogram has been performed.  Jennette Dubin 02/03/2020, 8:32 AM

## 2020-03-12 DIAGNOSIS — L821 Other seborrheic keratosis: Secondary | ICD-10-CM | POA: Diagnosis not present

## 2020-03-12 DIAGNOSIS — D485 Neoplasm of uncertain behavior of skin: Secondary | ICD-10-CM | POA: Diagnosis not present

## 2020-03-12 DIAGNOSIS — D225 Melanocytic nevi of trunk: Secondary | ICD-10-CM | POA: Diagnosis not present

## 2020-03-12 DIAGNOSIS — L814 Other melanin hyperpigmentation: Secondary | ICD-10-CM | POA: Diagnosis not present

## 2020-03-12 DIAGNOSIS — L719 Rosacea, unspecified: Secondary | ICD-10-CM | POA: Diagnosis not present

## 2020-03-12 DIAGNOSIS — Z86018 Personal history of other benign neoplasm: Secondary | ICD-10-CM | POA: Diagnosis not present

## 2020-03-12 DIAGNOSIS — Z808 Family history of malignant neoplasm of other organs or systems: Secondary | ICD-10-CM | POA: Diagnosis not present

## 2020-03-12 DIAGNOSIS — L578 Other skin changes due to chronic exposure to nonionizing radiation: Secondary | ICD-10-CM | POA: Diagnosis not present

## 2020-03-30 ENCOUNTER — Telehealth: Payer: Self-pay | Admitting: Neurology

## 2020-03-30 ENCOUNTER — Ambulatory Visit: Payer: 59 | Admitting: Neurology

## 2020-03-30 ENCOUNTER — Encounter: Payer: Self-pay | Admitting: Neurology

## 2020-03-30 VITALS — BP 125/77 | HR 74 | Ht 64.0 in | Wt 155.2 lb

## 2020-03-30 DIAGNOSIS — H5461 Unqualified visual loss, right eye, normal vision left eye: Secondary | ICD-10-CM | POA: Diagnosis not present

## 2020-03-30 NOTE — Telephone Encounter (Signed)
Patient returned my call she is scheduled at [facility] for 04/06/20.

## 2020-03-30 NOTE — Progress Notes (Signed)
Chief Complaint  Patient presents with  . New Patient (Initial Visit)    Referred for amaurosis fugax. Reports an episode of a sudden loss of vision in her right eye that lasted around one minute. This was in December 2021 and only occurred once. She had a normal eye exam after this event. No further issues.       ASSESSMENT AND PLAN  Angie Fisher is a 65 y.o. female   Sudden right eye visual loss in April 2021  Fully recovered within couple minutes  Ultrasound of carotid artery, echocardiogram showed no significant abnormality  Possible right amaurosis fugax,  Complete evaluation with MRI of the brain  Emphasized importance of increasing water intake, aspirin 81 mg daily    DIAGNOSTIC DATA (LABS, IMAGING, TESTING) - I reviewed patient records, labs, notes, testing and imaging myself where available.  Laboratory evaluations January 22, 2020: S1.27, normal vitamin D 36, TSH, ESR was 3, normal CMP creatinine of 0.69, CBC hemoglobin of 15.8, normal protein C, S function   HISTORICAL  Angie Fisher is a 65 year old female, seen in request by her primary care doctor   Little, Lennette Bihari, for evaluation of 1 episode of sudden right visual loss, initial evaluation was on March 30, 2020  I reviewed and summarized the referring note.  Past medical history Hyperlipidemia Glaucoma  In April 2021 at the end of the day while she was trying to shut down computer, still in a seated position, she suddenly noticed blurry vision, she described as if somebody covered her right eye, she was able to cover 1 eye versus the other, was able to see clearly with left eye only, she could not see anything with right eye only, this has alarmed her, when she tried to get up, her vision quickly came back, whole episode lasted less than 2 minutes, she denies headache, was fine after that  She was evaluated by ophthalmologist, there was no significant abnormal negative found by them,  Ultrasound of carotid artery  April 2021 showed minimal amount of bilateral intimal wall thickening, no hemodynamic significant stenosis  Echocardiogram February 2022, ejection fraction 60 to 65%, no significant abnormality.  REVIEW OF SYSTEMS: Full 14 system review of systems performed and notable only for as above All other review of systems were negative.  PHYSICAL EXAM   Vitals:   03/30/20 0858  BP: 125/77  Pulse: 74  Weight: 155 lb 3.2 oz (70.4 kg)  Height: '5\' 4"'  (1.626 m)   Not recorded     Body mass index is 26.64 kg/m.  PHYSICAL EXAMNIATION:  Gen: NAD, conversant, well nourised, well groomed                     Cardiovascular: Regular rate rhythm, no peripheral edema, warm, nontender. Eyes: Conjunctivae clear without exudates or hemorrhage Neck: Supple, no carotid bruits. Pulmonary: Clear to auscultation bilaterally   NEUROLOGICAL EXAM:  MENTAL STATUS: Speech:    Speech is normal; fluent and spontaneous with normal comprehension.  Cognition:     Orientation to time, place and person     Normal recent and remote memory     Normal Attention span and concentration     Normal Language, naming, repeating,spontaneous speech     Fund of knowledge   CRANIAL NERVES: CN II: Visual fields are full to confrontation. Pupils are round equal and briskly reactive to light. CN III, IV, VI: extraocular movement are normal. No ptosis. CN V: Facial sensation is intact  to light touch CN VII: Face is symmetric with normal eye closure  CN VIII: Hearing is normal to causal conversation. CN IX, X: Phonation is normal. CN XI: Head turning and shoulder shrug are intact  MOTOR: There is no pronator drift of out-stretched arms. Muscle bulk and tone are normal. Muscle strength is normal.  REFLEXES: Reflexes are 2+ and symmetric at the biceps, triceps, knees, and ankles. Plantar responses are flexor.  SENSORY: Intact to light touch, pinprick and vibratory sensation are intact in fingers and  toes.  COORDINATION: There is no trunk or limb dysmetria noted.  GAIT/STANCE: Posture is normal. Gait is steady with normal steps, base, arm swing, and turning. Heel and toe walking are normal. Tandem gait is normal.  Romberg is absent.  ALLERGIES: Allergies  Allergen Reactions  . Lidocaine Hives and Rash  . Morphine Other (See Comments)    crazy  . Sulfonamide Derivatives     REACTION: Nausea/vomiting  . Zoster Vac Recomb Adjuvanted     Other reaction(s): increase HR, arm numbness    HOME MEDICATIONS: Current Outpatient Medications  Medication Sig Dispense Refill  . Cholecalciferol (VITAMIN D3) 50 MCG (2000 UT) TABS Take 2,000 mcg by mouth daily.    Marland Kitchen estradiol (ESTRACE) 2 MG tablet Take 2 mg by mouth as directed.    . simvastatin (ZOCOR) 40 MG tablet Take 40 mg by mouth at bedtime.    . timolol (TIMOPTIC) 0.5 % ophthalmic solution      No current facility-administered medications for this visit.    PAST MEDICAL HISTORY: Past Medical History:  Diagnosis Date  . Anxiety   . Colon polyp   . Glaucoma   . History of melanoma   . Hyperlipidemia   . Vitamin D deficiency     PAST SURGICAL HISTORY: Past Surgical History:  Procedure Laterality Date  . APPENDECTOMY    . GALLBLADDER SURGERY    . HERNIA REPAIR    . PARTIAL HYSTERECTOMY    . SKIN CANCER EXCISION      FAMILY HISTORY: Family History  Problem Relation Age of Onset  . Hypertension Mother   . Diabetes Mother   . Dementia Mother   . Glaucoma Mother   . Bone cancer Father     SOCIAL HISTORY: Social History   Socioeconomic History  . Marital status: Married    Spouse name: Not on file  . Number of children: 2  . Years of education: 36  . Highest education level: High school graduate  Occupational History  . Occupation: Retired  Tobacco Use  . Smoking status: Former Research scientist (life sciences)  . Smokeless tobacco: Never Used  Vaping Use  . Vaping Use: Never used  Substance and Sexual Activity  . Alcohol use:  Yes    Comment: social  . Drug use: Never  . Sexual activity: Not on file  Other Topics Concern  . Not on file  Social History Narrative   Lives at home with her husband.   1-2 cups caffeine per day.   Right-handed.   Social Determinants of Health   Financial Resource Strain: Not on file  Food Insecurity: Not on file  Transportation Needs: Not on file  Physical Activity: Not on file  Stress: Not on file  Social Connections: Not on file  Intimate Partner Violence: Not on file      Marcial Pacas, M.D. Ph.D.  [facility] [address], [place], [place] [address] Ph: [phone] Fax: [phone]  CC:  Hulan Fess,  MD [place],  [place] [address]  Hulan Fess, MD

## 2020-03-30 NOTE — Telephone Encounter (Signed)
LVM for pt to call back about scheduling [facility] auth: [facility] Ref # [id number]

## 2020-04-06 ENCOUNTER — Ambulatory Visit: Payer: 59

## 2020-04-06 ENCOUNTER — Other Ambulatory Visit: Payer: Self-pay

## 2020-04-06 DIAGNOSIS — H5461 Unqualified visual loss, right eye, normal vision left eye: Secondary | ICD-10-CM | POA: Diagnosis not present

## 2020-04-26 DIAGNOSIS — H401131 Primary open-angle glaucoma, bilateral, mild stage: Secondary | ICD-10-CM | POA: Diagnosis not present

## 2020-05-03 DIAGNOSIS — L988 Other specified disorders of the skin and subcutaneous tissue: Secondary | ICD-10-CM | POA: Diagnosis not present

## 2020-05-03 DIAGNOSIS — D239 Other benign neoplasm of skin, unspecified: Secondary | ICD-10-CM | POA: Diagnosis not present

## 2020-06-03 DIAGNOSIS — Z1231 Encounter for screening mammogram for malignant neoplasm of breast: Secondary | ICD-10-CM | POA: Diagnosis not present

## 2020-06-03 DIAGNOSIS — Z01419 Encounter for gynecological examination (general) (routine) without abnormal findings: Secondary | ICD-10-CM | POA: Diagnosis not present

## 2020-06-03 DIAGNOSIS — Z6825 Body mass index (BMI) 25.0-25.9, adult: Secondary | ICD-10-CM | POA: Diagnosis not present

## 2020-06-15 ENCOUNTER — Other Ambulatory Visit (HOSPITAL_BASED_OUTPATIENT_CLINIC_OR_DEPARTMENT_OTHER): Payer: Self-pay

## 2020-06-15 MED ORDER — ESTRADIOL 2 MG PO TABS
ORAL_TABLET | ORAL | 99 refills | Status: DC
  Filled 2020-06-15: qty 90, 90d supply, fill #0
  Filled 2020-09-30: qty 90, 90d supply, fill #1
  Filled 2021-02-09: qty 90, 90d supply, fill #2

## 2020-06-15 MED ORDER — ALPRAZOLAM 0.5 MG PO TABS
ORAL_TABLET | ORAL | 4 refills | Status: DC
  Filled 2020-06-15: qty 60, 30d supply, fill #0
  Filled 2020-08-10: qty 60, 30d supply, fill #1
  Filled 2020-09-30: qty 60, 30d supply, fill #2

## 2020-06-16 ENCOUNTER — Other Ambulatory Visit (HOSPITAL_BASED_OUTPATIENT_CLINIC_OR_DEPARTMENT_OTHER): Payer: Self-pay

## 2020-06-16 MED ORDER — SIMVASTATIN 40 MG PO TABS
ORAL_TABLET | ORAL | 2 refills | Status: DC
  Filled 2020-06-16: qty 90, 90d supply, fill #0
  Filled 2020-09-30: qty 90, 90d supply, fill #1
  Filled 2021-02-09: qty 90, 90d supply, fill #2

## 2020-08-10 ENCOUNTER — Other Ambulatory Visit (HOSPITAL_BASED_OUTPATIENT_CLINIC_OR_DEPARTMENT_OTHER): Payer: Self-pay

## 2020-09-15 ENCOUNTER — Other Ambulatory Visit (HOSPITAL_BASED_OUTPATIENT_CLINIC_OR_DEPARTMENT_OTHER): Payer: Self-pay

## 2020-09-15 MED FILL — Timolol Maleate Ophth Soln 0.5%: OPHTHALMIC | 75 days supply | Qty: 15 | Fill #0 | Status: AC

## 2020-09-30 ENCOUNTER — Other Ambulatory Visit (HOSPITAL_BASED_OUTPATIENT_CLINIC_OR_DEPARTMENT_OTHER): Payer: Self-pay

## 2020-10-29 DIAGNOSIS — H401131 Primary open-angle glaucoma, bilateral, mild stage: Secondary | ICD-10-CM | POA: Diagnosis not present

## 2020-10-29 DIAGNOSIS — H2513 Age-related nuclear cataract, bilateral: Secondary | ICD-10-CM | POA: Diagnosis not present

## 2020-12-17 ENCOUNTER — Other Ambulatory Visit (HOSPITAL_BASED_OUTPATIENT_CLINIC_OR_DEPARTMENT_OTHER): Payer: Self-pay

## 2020-12-20 ENCOUNTER — Other Ambulatory Visit (HOSPITAL_BASED_OUTPATIENT_CLINIC_OR_DEPARTMENT_OTHER): Payer: Self-pay

## 2020-12-20 MED ORDER — TIMOLOL MALEATE 0.5 % OP SOLN
OPHTHALMIC | 6 refills | Status: DC
  Filled 2020-12-20: qty 15, 38d supply, fill #0

## 2021-01-25 ENCOUNTER — Other Ambulatory Visit (HOSPITAL_BASED_OUTPATIENT_CLINIC_OR_DEPARTMENT_OTHER): Payer: Self-pay

## 2021-01-25 DIAGNOSIS — Z0000 Encounter for general adult medical examination without abnormal findings: Secondary | ICD-10-CM | POA: Diagnosis not present

## 2021-01-25 DIAGNOSIS — E559 Vitamin D deficiency, unspecified: Secondary | ICD-10-CM | POA: Diagnosis not present

## 2021-01-25 DIAGNOSIS — M25552 Pain in left hip: Secondary | ICD-10-CM | POA: Diagnosis not present

## 2021-01-25 DIAGNOSIS — E7800 Pure hypercholesterolemia, unspecified: Secondary | ICD-10-CM | POA: Diagnosis not present

## 2021-01-25 DIAGNOSIS — Z1211 Encounter for screening for malignant neoplasm of colon: Secondary | ICD-10-CM | POA: Diagnosis not present

## 2021-01-25 MED ORDER — SIMVASTATIN 40 MG PO TABS
ORAL_TABLET | ORAL | 2 refills | Status: DC
  Filled 2021-01-25: qty 90, 90d supply, fill #0

## 2021-02-01 ENCOUNTER — Other Ambulatory Visit (HOSPITAL_BASED_OUTPATIENT_CLINIC_OR_DEPARTMENT_OTHER): Payer: Self-pay

## 2021-02-03 ENCOUNTER — Ambulatory Visit
Admission: RE | Admit: 2021-02-03 | Discharge: 2021-02-03 | Disposition: A | Discharge status: Home / Self Care | Payer: 59 | Source: Ambulatory Visit | Attending: Sports Medicine | Admitting: Sports Medicine

## 2021-02-03 ENCOUNTER — Other Ambulatory Visit: Payer: Self-pay

## 2021-02-03 ENCOUNTER — Other Ambulatory Visit: Payer: Self-pay | Admitting: Sports Medicine

## 2021-02-03 DIAGNOSIS — M545 Low back pain, unspecified: Secondary | ICD-10-CM | POA: Diagnosis not present

## 2021-02-03 DIAGNOSIS — R52 Pain, unspecified: Secondary | ICD-10-CM

## 2021-02-03 DIAGNOSIS — M5136 Other intervertebral disc degeneration, lumbar region: Secondary | ICD-10-CM | POA: Diagnosis not present

## 2021-02-09 ENCOUNTER — Other Ambulatory Visit (HOSPITAL_BASED_OUTPATIENT_CLINIC_OR_DEPARTMENT_OTHER): Payer: Self-pay

## 2021-02-09 MED ORDER — TIMOLOL MALEATE 0.5 % OP SOLN
1.0000 [drp] | Freq: Two times a day (BID) | OPHTHALMIC | 4 refills | Status: AC
  Filled 2021-02-09: qty 10, 50d supply, fill #0
  Filled 2021-04-22: qty 10, 50d supply, fill #1
  Filled 2021-07-12: qty 10, 50d supply, fill #2
  Filled 2021-09-02: qty 10, 50d supply, fill #3
  Filled 2021-10-14: qty 10, 50d supply, fill #4

## 2021-02-14 ENCOUNTER — Other Ambulatory Visit (HOSPITAL_BASED_OUTPATIENT_CLINIC_OR_DEPARTMENT_OTHER): Payer: Self-pay

## 2021-03-15 DIAGNOSIS — Z86018 Personal history of other benign neoplasm: Secondary | ICD-10-CM | POA: Diagnosis not present

## 2021-03-15 DIAGNOSIS — Z23 Encounter for immunization: Secondary | ICD-10-CM | POA: Diagnosis not present

## 2021-03-15 DIAGNOSIS — D225 Melanocytic nevi of trunk: Secondary | ICD-10-CM | POA: Diagnosis not present

## 2021-03-15 DIAGNOSIS — Z808 Family history of malignant neoplasm of other organs or systems: Secondary | ICD-10-CM | POA: Diagnosis not present

## 2021-03-15 DIAGNOSIS — L821 Other seborrheic keratosis: Secondary | ICD-10-CM | POA: Diagnosis not present

## 2021-03-15 DIAGNOSIS — L814 Other melanin hyperpigmentation: Secondary | ICD-10-CM | POA: Diagnosis not present

## 2021-03-15 DIAGNOSIS — L578 Other skin changes due to chronic exposure to nonionizing radiation: Secondary | ICD-10-CM | POA: Diagnosis not present

## 2021-04-22 ENCOUNTER — Other Ambulatory Visit (HOSPITAL_BASED_OUTPATIENT_CLINIC_OR_DEPARTMENT_OTHER): Payer: Self-pay

## 2021-04-25 DIAGNOSIS — H401131 Primary open-angle glaucoma, bilateral, mild stage: Secondary | ICD-10-CM | POA: Diagnosis not present

## 2021-06-02 ENCOUNTER — Other Ambulatory Visit (HOSPITAL_BASED_OUTPATIENT_CLINIC_OR_DEPARTMENT_OTHER): Payer: Self-pay

## 2021-06-02 MED ORDER — SIMVASTATIN 40 MG PO TABS
ORAL_TABLET | ORAL | 2 refills | Status: DC
  Filled 2021-06-02: qty 90, 90d supply, fill #0
  Filled 2021-09-02: qty 90, 90d supply, fill #1
  Filled 2021-12-02: qty 90, 90d supply, fill #2

## 2021-06-09 ENCOUNTER — Other Ambulatory Visit (HOSPITAL_BASED_OUTPATIENT_CLINIC_OR_DEPARTMENT_OTHER): Payer: Self-pay

## 2021-06-09 DIAGNOSIS — Z01419 Encounter for gynecological examination (general) (routine) without abnormal findings: Secondary | ICD-10-CM | POA: Diagnosis not present

## 2021-06-09 DIAGNOSIS — Z1231 Encounter for screening mammogram for malignant neoplasm of breast: Secondary | ICD-10-CM | POA: Diagnosis not present

## 2021-06-09 DIAGNOSIS — Z79890 Hormone replacement therapy (postmenopausal): Secondary | ICD-10-CM | POA: Diagnosis not present

## 2021-06-09 DIAGNOSIS — Z6825 Body mass index (BMI) 25.0-25.9, adult: Secondary | ICD-10-CM | POA: Diagnosis not present

## 2021-06-09 MED ORDER — ESTRADIOL 1 MG PO TABS
1.0000 mg | ORAL_TABLET | Freq: Every day | ORAL | 4 refills | Status: DC
  Filled 2021-06-09: qty 30, 30d supply, fill #0

## 2021-07-12 ENCOUNTER — Other Ambulatory Visit (HOSPITAL_BASED_OUTPATIENT_CLINIC_OR_DEPARTMENT_OTHER): Payer: Self-pay

## 2021-09-02 ENCOUNTER — Other Ambulatory Visit (HOSPITAL_BASED_OUTPATIENT_CLINIC_OR_DEPARTMENT_OTHER): Payer: Self-pay

## 2021-10-14 ENCOUNTER — Other Ambulatory Visit (HOSPITAL_BASED_OUTPATIENT_CLINIC_OR_DEPARTMENT_OTHER): Payer: Self-pay

## 2021-11-10 DIAGNOSIS — H401131 Primary open-angle glaucoma, bilateral, mild stage: Secondary | ICD-10-CM | POA: Diagnosis not present

## 2021-11-10 DIAGNOSIS — H2513 Age-related nuclear cataract, bilateral: Secondary | ICD-10-CM | POA: Diagnosis not present

## 2021-11-10 DIAGNOSIS — H04123 Dry eye syndrome of bilateral lacrimal glands: Secondary | ICD-10-CM | POA: Diagnosis not present

## 2021-12-02 ENCOUNTER — Other Ambulatory Visit (HOSPITAL_BASED_OUTPATIENT_CLINIC_OR_DEPARTMENT_OTHER): Payer: Self-pay

## 2021-12-05 ENCOUNTER — Other Ambulatory Visit (HOSPITAL_BASED_OUTPATIENT_CLINIC_OR_DEPARTMENT_OTHER): Payer: Self-pay

## 2021-12-05 MED ORDER — TIMOLOL MALEATE 0.5 % OP SOLN
1.0000 [drp] | Freq: Two times a day (BID) | OPHTHALMIC | 4 refills | Status: DC
  Filled 2021-12-05: qty 10, 50d supply, fill #0
  Filled 2022-01-17: qty 10, 50d supply, fill #1
  Filled 2022-03-15: qty 10, 50d supply, fill #2
  Filled 2022-05-02: qty 10, 50d supply, fill #3
  Filled 2022-06-23: qty 10, 50d supply, fill #4

## 2021-12-06 ENCOUNTER — Other Ambulatory Visit (HOSPITAL_BASED_OUTPATIENT_CLINIC_OR_DEPARTMENT_OTHER): Payer: Self-pay

## 2022-01-27 DIAGNOSIS — R42 Dizziness and giddiness: Secondary | ICD-10-CM | POA: Diagnosis not present

## 2022-01-27 DIAGNOSIS — R519 Headache, unspecified: Secondary | ICD-10-CM | POA: Diagnosis not present

## 2022-01-27 DIAGNOSIS — R200 Anesthesia of skin: Secondary | ICD-10-CM | POA: Diagnosis not present

## 2022-01-28 DIAGNOSIS — E042 Nontoxic multinodular goiter: Secondary | ICD-10-CM | POA: Diagnosis not present

## 2022-01-28 DIAGNOSIS — R079 Chest pain, unspecified: Secondary | ICD-10-CM | POA: Diagnosis not present

## 2022-01-28 DIAGNOSIS — R200 Anesthesia of skin: Secondary | ICD-10-CM | POA: Diagnosis not present

## 2022-01-28 DIAGNOSIS — R0989 Other specified symptoms and signs involving the circulatory and respiratory systems: Secondary | ICD-10-CM | POA: Diagnosis not present

## 2022-01-28 DIAGNOSIS — M47812 Spondylosis without myelopathy or radiculopathy, cervical region: Secondary | ICD-10-CM | POA: Diagnosis not present

## 2022-01-30 ENCOUNTER — Other Ambulatory Visit (HOSPITAL_BASED_OUTPATIENT_CLINIC_OR_DEPARTMENT_OTHER): Payer: Self-pay

## 2022-01-30 DIAGNOSIS — E559 Vitamin D deficiency, unspecified: Secondary | ICD-10-CM | POA: Diagnosis not present

## 2022-01-30 DIAGNOSIS — R202 Paresthesia of skin: Secondary | ICD-10-CM | POA: Diagnosis not present

## 2022-01-30 DIAGNOSIS — Z0000 Encounter for general adult medical examination without abnormal findings: Secondary | ICD-10-CM | POA: Diagnosis not present

## 2022-01-30 DIAGNOSIS — Z8669 Personal history of other diseases of the nervous system and sense organs: Secondary | ICD-10-CM | POA: Diagnosis not present

## 2022-01-30 DIAGNOSIS — H409 Unspecified glaucoma: Secondary | ICD-10-CM | POA: Diagnosis not present

## 2022-01-30 DIAGNOSIS — Z6825 Body mass index (BMI) 25.0-25.9, adult: Secondary | ICD-10-CM | POA: Diagnosis not present

## 2022-01-30 DIAGNOSIS — E7800 Pure hypercholesterolemia, unspecified: Secondary | ICD-10-CM | POA: Diagnosis not present

## 2022-01-30 MED ORDER — SIMVASTATIN 40 MG PO TABS
40.0000 mg | ORAL_TABLET | Freq: Every evening | ORAL | 2 refills | Status: DC
  Filled 2022-01-30 – 2022-03-15 (×2): qty 90, 90d supply, fill #0

## 2022-03-15 ENCOUNTER — Other Ambulatory Visit (HOSPITAL_BASED_OUTPATIENT_CLINIC_OR_DEPARTMENT_OTHER): Payer: Self-pay

## 2022-03-16 DIAGNOSIS — L821 Other seborrheic keratosis: Secondary | ICD-10-CM | POA: Diagnosis not present

## 2022-03-16 DIAGNOSIS — L578 Other skin changes due to chronic exposure to nonionizing radiation: Secondary | ICD-10-CM | POA: Diagnosis not present

## 2022-03-16 DIAGNOSIS — Z808 Family history of malignant neoplasm of other organs or systems: Secondary | ICD-10-CM | POA: Diagnosis not present

## 2022-03-16 DIAGNOSIS — L719 Rosacea, unspecified: Secondary | ICD-10-CM | POA: Diagnosis not present

## 2022-03-16 DIAGNOSIS — Z86018 Personal history of other benign neoplasm: Secondary | ICD-10-CM | POA: Diagnosis not present

## 2022-03-16 DIAGNOSIS — L814 Other melanin hyperpigmentation: Secondary | ICD-10-CM | POA: Diagnosis not present

## 2022-03-16 DIAGNOSIS — D485 Neoplasm of uncertain behavior of skin: Secondary | ICD-10-CM | POA: Diagnosis not present

## 2022-03-16 DIAGNOSIS — D225 Melanocytic nevi of trunk: Secondary | ICD-10-CM | POA: Diagnosis not present

## 2022-03-17 DIAGNOSIS — L719 Rosacea, unspecified: Secondary | ICD-10-CM | POA: Diagnosis not present

## 2022-03-17 DIAGNOSIS — D485 Neoplasm of uncertain behavior of skin: Secondary | ICD-10-CM | POA: Diagnosis not present

## 2022-03-17 DIAGNOSIS — L814 Other melanin hyperpigmentation: Secondary | ICD-10-CM | POA: Diagnosis not present

## 2022-03-17 DIAGNOSIS — L578 Other skin changes due to chronic exposure to nonionizing radiation: Secondary | ICD-10-CM | POA: Diagnosis not present

## 2022-03-17 DIAGNOSIS — D225 Melanocytic nevi of trunk: Secondary | ICD-10-CM | POA: Diagnosis not present

## 2022-03-17 DIAGNOSIS — L821 Other seborrheic keratosis: Secondary | ICD-10-CM | POA: Diagnosis not present

## 2022-03-17 DIAGNOSIS — Z808 Family history of malignant neoplasm of other organs or systems: Secondary | ICD-10-CM | POA: Diagnosis not present

## 2022-03-17 DIAGNOSIS — Z86018 Personal history of other benign neoplasm: Secondary | ICD-10-CM | POA: Diagnosis not present

## 2022-03-28 DIAGNOSIS — D485 Neoplasm of uncertain behavior of skin: Secondary | ICD-10-CM | POA: Diagnosis not present

## 2022-04-06 ENCOUNTER — Other Ambulatory Visit (HOSPITAL_BASED_OUTPATIENT_CLINIC_OR_DEPARTMENT_OTHER): Payer: Self-pay

## 2022-04-06 DIAGNOSIS — Z6825 Body mass index (BMI) 25.0-25.9, adult: Secondary | ICD-10-CM | POA: Diagnosis not present

## 2022-04-06 DIAGNOSIS — M25511 Pain in right shoulder: Secondary | ICD-10-CM | POA: Diagnosis not present

## 2022-04-06 DIAGNOSIS — H9311 Tinnitus, right ear: Secondary | ICD-10-CM | POA: Diagnosis not present

## 2022-04-06 MED ORDER — TRAMADOL HCL 50 MG PO TABS
50.0000 mg | ORAL_TABLET | Freq: Every day | ORAL | 0 refills | Status: DC | PRN
  Filled 2022-04-06: qty 10, 10d supply, fill #0

## 2022-04-17 ENCOUNTER — Other Ambulatory Visit (HOSPITAL_BASED_OUTPATIENT_CLINIC_OR_DEPARTMENT_OTHER): Payer: Self-pay

## 2022-04-17 DIAGNOSIS — M542 Cervicalgia: Secondary | ICD-10-CM | POA: Diagnosis not present

## 2022-04-17 DIAGNOSIS — M25511 Pain in right shoulder: Secondary | ICD-10-CM | POA: Diagnosis not present

## 2022-04-17 MED ORDER — METHOCARBAMOL 500 MG PO TABS
500.0000 mg | ORAL_TABLET | Freq: Three times a day (TID) | ORAL | 1 refills | Status: DC | PRN
  Filled 2022-04-17: qty 30, 10d supply, fill #0
  Filled 2022-05-04: qty 30, 10d supply, fill #1

## 2022-04-17 MED ORDER — PREDNISONE 5 MG PO TABS
ORAL_TABLET | ORAL | 1 refills | Status: DC
  Filled 2022-04-17: qty 21, 6d supply, fill #0

## 2022-04-21 ENCOUNTER — Other Ambulatory Visit (HOSPITAL_COMMUNITY): Payer: Self-pay

## 2022-04-21 ENCOUNTER — Other Ambulatory Visit (HOSPITAL_BASED_OUTPATIENT_CLINIC_OR_DEPARTMENT_OTHER): Payer: Self-pay

## 2022-04-21 DIAGNOSIS — Z6825 Body mass index (BMI) 25.0-25.9, adult: Secondary | ICD-10-CM | POA: Diagnosis not present

## 2022-04-21 DIAGNOSIS — H00023 Hordeolum internum right eye, unspecified eyelid: Secondary | ICD-10-CM | POA: Diagnosis not present

## 2022-04-21 MED ORDER — BACITRA-NEOMYCIN-POLYMYXIN-HC 1 % OP OINT
TOPICAL_OINTMENT | OPHTHALMIC | 0 refills | Status: DC
  Filled 2022-04-21 (×2): qty 3.5, 14d supply, fill #0

## 2022-04-27 DIAGNOSIS — M25511 Pain in right shoulder: Secondary | ICD-10-CM | POA: Diagnosis not present

## 2022-04-27 DIAGNOSIS — M542 Cervicalgia: Secondary | ICD-10-CM | POA: Diagnosis not present

## 2022-04-28 ENCOUNTER — Other Ambulatory Visit (HOSPITAL_BASED_OUTPATIENT_CLINIC_OR_DEPARTMENT_OTHER): Payer: Self-pay

## 2022-04-28 DIAGNOSIS — H0011 Chalazion right upper eyelid: Secondary | ICD-10-CM | POA: Diagnosis not present

## 2022-04-28 DIAGNOSIS — H01002 Unspecified blepharitis right lower eyelid: Secondary | ICD-10-CM | POA: Diagnosis not present

## 2022-04-28 DIAGNOSIS — H01005 Unspecified blepharitis left lower eyelid: Secondary | ICD-10-CM | POA: Diagnosis not present

## 2022-04-28 MED ORDER — ERYTHROMYCIN 5 MG/GM OP OINT
TOPICAL_OINTMENT | OPHTHALMIC | 1 refills | Status: DC
  Filled 2022-04-28: qty 3.5, 14d supply, fill #0
  Filled 2022-05-08 – 2022-05-09 (×3): qty 3.5, 14d supply, fill #1

## 2022-05-01 ENCOUNTER — Other Ambulatory Visit (HOSPITAL_BASED_OUTPATIENT_CLINIC_OR_DEPARTMENT_OTHER): Payer: Self-pay

## 2022-05-01 DIAGNOSIS — M25511 Pain in right shoulder: Secondary | ICD-10-CM | POA: Diagnosis not present

## 2022-05-01 DIAGNOSIS — M542 Cervicalgia: Secondary | ICD-10-CM | POA: Diagnosis not present

## 2022-05-01 MED ORDER — TRAMADOL HCL 50 MG PO TABS
50.0000 mg | ORAL_TABLET | Freq: Every day | ORAL | 0 refills | Status: DC
  Filled 2022-05-01: qty 30, 30d supply, fill #0

## 2022-05-08 ENCOUNTER — Other Ambulatory Visit (HOSPITAL_BASED_OUTPATIENT_CLINIC_OR_DEPARTMENT_OTHER): Payer: Self-pay

## 2022-05-11 DIAGNOSIS — H401111 Primary open-angle glaucoma, right eye, mild stage: Secondary | ICD-10-CM | POA: Diagnosis not present

## 2022-05-11 DIAGNOSIS — H2513 Age-related nuclear cataract, bilateral: Secondary | ICD-10-CM | POA: Diagnosis not present

## 2022-05-11 DIAGNOSIS — H0011 Chalazion right upper eyelid: Secondary | ICD-10-CM | POA: Diagnosis not present

## 2022-05-15 DIAGNOSIS — M542 Cervicalgia: Secondary | ICD-10-CM | POA: Diagnosis not present

## 2022-05-15 DIAGNOSIS — M25511 Pain in right shoulder: Secondary | ICD-10-CM | POA: Diagnosis not present

## 2022-05-18 ENCOUNTER — Other Ambulatory Visit (HOSPITAL_BASED_OUTPATIENT_CLINIC_OR_DEPARTMENT_OTHER): Payer: Self-pay

## 2022-05-18 DIAGNOSIS — Z01818 Encounter for other preprocedural examination: Secondary | ICD-10-CM | POA: Diagnosis not present

## 2022-05-18 DIAGNOSIS — M25511 Pain in right shoulder: Secondary | ICD-10-CM | POA: Diagnosis not present

## 2022-05-18 DIAGNOSIS — Z6825 Body mass index (BMI) 25.0-25.9, adult: Secondary | ICD-10-CM | POA: Diagnosis not present

## 2022-05-18 MED ORDER — SIMVASTATIN 40 MG PO TABS
40.0000 mg | ORAL_TABLET | Freq: Every evening | ORAL | 2 refills | Status: DC
  Filled 2022-05-18 – 2022-06-23 (×2): qty 90, 90d supply, fill #0
  Filled 2022-11-23: qty 90, 90d supply, fill #1
  Filled 2023-03-02: qty 90, 90d supply, fill #2

## 2022-05-19 ENCOUNTER — Other Ambulatory Visit (HOSPITAL_BASED_OUTPATIENT_CLINIC_OR_DEPARTMENT_OTHER): Payer: Self-pay

## 2022-05-23 ENCOUNTER — Other Ambulatory Visit (HOSPITAL_BASED_OUTPATIENT_CLINIC_OR_DEPARTMENT_OTHER): Payer: Self-pay

## 2022-05-23 MED ORDER — TRAMADOL HCL 50 MG PO TABS
50.0000 mg | ORAL_TABLET | Freq: Every day | ORAL | 0 refills | Status: DC
  Filled 2022-05-23 – 2022-05-30 (×2): qty 30, 30d supply, fill #0

## 2022-05-25 ENCOUNTER — Ambulatory Visit: Payer: Self-pay | Admitting: Orthopedic Surgery

## 2022-05-26 ENCOUNTER — Other Ambulatory Visit (HOSPITAL_BASED_OUTPATIENT_CLINIC_OR_DEPARTMENT_OTHER): Payer: Self-pay

## 2022-05-26 NOTE — Patient Instructions (Signed)
SURGICAL WAITING ROOM VISITATION Patients having surgery or a procedure may have no more than 2 support people in the waiting area - these visitors may rotate.    Children under the age of 47 must have an adult with them who is not the patient.  If the patient needs to stay at the hospital during part of their recovery, the visitor guidelines for inpatient rooms apply. Pre-op nurse will coordinate an appropriate time for 1 support person to accompany patient in pre-op.  This support person may not rotate.    Please refer to the [facility] website for the visitor guidelines for Inpatients (after your surgery is over and you are in a regular room).       Your procedure is scheduled on: 06-14-22   Report to [facility] Main Entrance    Report to admitting at 9:15 AM   Call this number if you have problems the morning of surgery [phone]   Do not eat food or drink liquids :After Midnight.            If you have questions, please contact your surgeon's office.   FOLLOW  ANY ADDITIONAL PRE OP INSTRUCTIONS YOU RECEIVED FROM YOUR SURGEON'S OFFICE!!!     Oral Hygiene is also important to reduce your risk of infection.                                    Remember - BRUSH YOUR TEETH THE MORNING OF SURGERY WITH YOUR REGULAR TOOTHPASTE   Do NOT smoke after Midnight   Take these medicines the morning of surgery with A SIP OF WATER:   Tramadol if needed  Okay to use eyedrops                              You may not have any metal on your body including hair pins, jewelry, and body piercing             Do not wear make-up, lotions, powders, perfumes or deodorant  Do not wear nail polish including gel and S&S, artificial/acrylic nails, or any other type of covering on natural nails including finger and toenails. If you have artificial nails, gel coating, etc. that needs to be removed by a nail salon please have this removed prior to surgery or surgery may need to be canceled/  delayed if the surgeon/ anesthesia feels like they are unable to be safely monitored.   Do not shave  48 hours prior to surgery.    Do not bring valuables to the hospital. [facility] IS NOT RESPONSIBLE   FOR VALUABLES.   Contacts, dentures or bridgework may not be worn into surgery.  DO NOT BRING YOUR HOME MEDICATIONS TO THE HOSPITAL. PHARMACY WILL DISPENSE MEDICATIONS LISTED ON YOUR MEDICATION LIST TO YOU DURING YOUR ADMISSION IN THE HOSPITAL!    Patients discharged on the day of surgery will not be allowed to drive home.  Someone NEEDS to stay with you for the first 24 hours after anesthesia.   Special Instructions: Bring a copy of your healthcare power of attorney and living will documents the day of surgery if you haven't scanned them before.              Please read over the following fact sheets you were given: IF YOU HAVE QUESTIONS ABOUT YOUR PRE-OP INSTRUCTIONS PLEASE CALL  [phone] Gwen  If you received a COVID test during your pre-op visit  it is requested that you wear a mask when out in public, stay away from anyone that may not be feeling well and notify your surgeon if you develop symptoms. If you test positive for Covid or have been in contact with anyone that has tested positive in the last 10 days please notify you surgeon.  [facility] - Preparing for Surgery Before surgery, you can play an important role.  Because skin is not sterile, your skin needs to be as free of germs as possible.  You can reduce the number of germs on your skin by washing with CHG (chlorahexidine gluconate) soap before surgery.  CHG is an antiseptic cleaner which kills germs and bonds with the skin to continue killing germs even after washing. Please DO NOT use if you have an allergy to CHG or antibacterial soaps.  If your skin becomes reddened/irritated stop using the CHG and inform your nurse when you arrive at Short Stay. Do not shave (including legs and underarms) for at least 48 hours prior to  the first CHG shower.  You may shave your face/neck.  Please follow these instructions carefully:  1.  Shower with CHG Soap the night before surgery and the  morning of surgery.  2.  If you choose to wash your hair, wash your hair first as usual with your normal  shampoo.  3.  After you shampoo, rinse your hair and body thoroughly to remove the shampoo.                             4.  Use CHG as you would any other liquid soap.  You can apply chg directly to the skin and wash.  Gently with a scrungie or clean washcloth.  5.  Apply the CHG Soap to your body ONLY FROM THE NECK DOWN.   Do   not use on face/ open                           Wound or open sores. Avoid contact with eyes, ears mouth and   genitals (private parts).                       Wash face,  Genitals (private parts) with your normal soap.             6.  Wash thoroughly, paying special attention to the area where your    surgery  will be performed.  7.  Thoroughly rinse your body with warm water from the neck down.  8.  DO NOT shower/wash with your normal soap after using and rinsing off the CHG Soap.                9.  Pat yourself dry with a clean towel.            10.  Wear clean pajamas.            11.  Place clean sheets on your bed the night of your first shower and do not  sleep with pets. Day of Surgery : Do not apply any lotions/deodorants the morning of surgery.  Please wear clean clothes to the hospital/surgery center.  FAILURE TO FOLLOW THESE INSTRUCTIONS MAY RESULT IN THE CANCELLATION OF YOUR SURGERY  PATIENT SIGNATURE_________________________________  NURSE SIGNATURE__________________________________  ________________________________________________________________________  Incentive Spirometer  An incentive spirometer is a tool that can help keep your lungs clear and active. This tool measures how well you are filling your lungs with each breath. Taking long deep breaths may help reverse or decrease the  chance of developing breathing (pulmonary) problems (especially infection) following: A long period of time when you are unable to move or be active. BEFORE THE PROCEDURE  If the spirometer includes an indicator to show your best effort, your nurse or respiratory therapist will set it to a desired goal. If possible, sit up straight or lean slightly forward. Try not to slouch. Hold the incentive spirometer in an upright position. INSTRUCTIONS FOR USE  Sit on the edge of your bed if possible, or sit up as far as you can in bed or on a chair. Hold the incentive spirometer in an upright position. Breathe out normally. Place the mouthpiece in your mouth and seal your lips tightly around it. Breathe in slowly and as deeply as possible, raising the piston or the ball toward the top of the column. Hold your breath for 3-5 seconds or for as long as possible. Allow the piston or ball to fall to the bottom of the column. Remove the mouthpiece from your mouth and breathe out normally. Rest for a few seconds and repeat Steps 1 through 7 at least 10 times every 1-2 hours when you are awake. Take your time and take a few normal breaths between deep breaths. The spirometer may include an indicator to show your best effort. Use the indicator as a goal to work toward during each repetition. After each set of 10 deep breaths, practice coughing to be sure your lungs are clear. If you have an incision (the cut made at the time of surgery), support your incision when coughing by placing a pillow or rolled up towels firmly against it. Once you are able to get out of bed, walk around indoors and cough well. You may stop using the incentive spirometer when instructed by your caregiver.  RISKS AND COMPLICATIONS Take your time so you do not get dizzy or light-headed. If you are in pain, you may need to take or ask for pain medication before doing incentive spirometry. It is harder to take a deep breath if you are having  pain. AFTER USE Rest and breathe slowly and easily. It can be helpful to keep track of a log of your progress. Your caregiver can provide you with a simple table to help with this. If you are using the spirometer at home, follow these instructions: SEEK MEDICAL CARE IF:  You are having difficultly using the spirometer. You have trouble using the spirometer as often as instructed. Your pain medication is not giving enough relief while using the spirometer. You develop fever of 100.5 F (38.1 C) or higher. SEEK IMMEDIATE MEDICAL CARE IF:  You cough up bloody sputum that had not been present before. You develop fever of 102 F (38.9 C) or greater. You develop worsening pain at or near the incision site. MAKE SURE YOU:  Understand these instructions. Will watch your condition. Will get help right away if you are not doing well or get worse. Document Released: 05/01/2006 Document Revised: 03/13/2011 Document Reviewed: 07/02/2006 [facility] Patient Information 2014 [employer], [place].   ________________________________________________________________________

## 2022-05-26 NOTE — Progress Notes (Signed)
COVID Vaccine Completed:  Date of COVID positive in last 90 days:  PCP - Catha Gosselin, MD Cardiologist -  Neurologist - Levert Feinstein, MD [phone])  Chest x-ray -  EKG - 01-27-22 [facility] Stress Test -  ECHO - 02-03-20 Epic Cardiac Cath -  Pacemaker/ICD device last checked: Spinal Cord Stimulator:  Bowel Prep -   Sleep Study -  CPAP -   Fasting Blood Sugar -  Checks Blood Sugar _____ times a day  Last dose of GLP1 agonist-  N/A GLP1 instructions:  N/A   Last dose of SGLT-2 inhibitors-  N/A SGLT-2 instructions: N/A   Blood Thinner Instructions:  Time Aspirin Instructions: Last Dose:  Activity level:  Can go up a flight of stairs and perform activities of daily living without stopping and without symptoms of chest pain or shortness of breath.  Able to exercise without symptoms  Unable to go up a flight of stairs without symptoms of     Anesthesia review:   Patient denies shortness of breath, fever, cough and chest pain at PAT appointment  Patient verbalized understanding of instructions that were given to them at the PAT appointment. Patient was also instructed that they will need to review over the PAT instructions again at home before surgery.

## 2022-05-30 ENCOUNTER — Other Ambulatory Visit: Payer: Self-pay

## 2022-05-30 ENCOUNTER — Other Ambulatory Visit (HOSPITAL_BASED_OUTPATIENT_CLINIC_OR_DEPARTMENT_OTHER): Payer: Self-pay

## 2022-05-31 ENCOUNTER — Other Ambulatory Visit: Payer: Self-pay

## 2022-05-31 ENCOUNTER — Encounter (HOSPITAL_COMMUNITY)
Admission: RE | Admit: 2022-05-31 | Discharge: 2022-05-31 | Disposition: A | Discharge status: Home / Self Care | Payer: Commercial Managed Care - PPO | Source: Ambulatory Visit | Attending: Specialist | Admitting: Specialist

## 2022-05-31 ENCOUNTER — Encounter (HOSPITAL_COMMUNITY): Payer: Self-pay

## 2022-05-31 DIAGNOSIS — Z01818 Encounter for other preprocedural examination: Secondary | ICD-10-CM | POA: Diagnosis not present

## 2022-05-31 HISTORY — DX: Unspecified osteoarthritis, unspecified site: M19.90

## 2022-06-01 ENCOUNTER — Ambulatory Visit: Payer: Self-pay | Admitting: Orthopedic Surgery

## 2022-06-01 NOTE — H&P (Signed)
Angie Fisher is an 67 y.o. female.   Chief Complaint: right shoulder pain HPI: Reason for Visit: (normal) visit for: (neck and right shoulder); 9 weeks Location (Upper Extremity): cervical spine pain on the right, ; right shoulder and upper arm pain Severity: pain level 8/10 Quality: aching Aggravating Factors: ROM, lifting, and crossing arms aggravate Alleviating Factors: 05/01/22 cortisone injection right shoulder helped for about 3 days Associated Symptoms: no numbness/tingling; no weakness Medications: helping a little; Tramadol qhs She has been doing her home exercise program that is painful she is not able to do her activities including gardening. This has been going on since March. Only 3 days of relief from her injection. Taking tramadol at night  Past Medical History:  Diagnosis Date   Anxiety    Arthritis    Colon polyp    Glaucoma    History of melanoma    Hyperlipidemia    Vitamin D deficiency     Past Surgical History:  Procedure Laterality Date   APPENDECTOMY     CHOLECYSTECTOMY     HERNIA REPAIR     PARTIAL HYSTERECTOMY     SKIN CANCER EXCISION      Family History  Problem Relation Age of Onset   Hypertension Mother    Diabetes Mother    Dementia Mother    Glaucoma Mother    Bone cancer Father    Social History:  reports that she has quit smoking. Her smoking use included cigarettes. She has a 3.25 pack-year smoking history. She has never used smokeless tobacco. She reports current alcohol use. She reports that she does not use drugs.  Allergies:  Allergies  Allergen Reactions   Lidocaine Hives and Rash   Sulfonamide Derivatives     REACTION: Nausea/vomiting   Zoster Vac Recomb Adjuvanted     Other reaction(s): increase HR, arm numbness   Current meds: erythromycin 5 mg/gram (0.5 %) eye ointment neomycin-bacitracin-poly-HC 3.5 mg-400-10,000 unit/g-1 % eye ointment simvastatin 40 mg tablet timoloL maleate 0.5 % eye drops traMADoL 50 mg  tablet Vitamin D3 50 mcg (2,000 unit) tablet  Review of Systems  Constitutional: Negative.   HENT: Negative.    Eyes: Negative.   Respiratory: Negative.    Cardiovascular: Negative.   Gastrointestinal: Negative.   Endocrine: Negative.   Genitourinary: Negative.   Musculoskeletal:  Positive for arthralgias and myalgias.  Neurological: Negative.   Psychiatric/Behavioral: Negative.      There were no vitals taken for this visit. Physical Exam Constitutional:      Appearance: Normal appearance.  HENT:     Head: Normocephalic.     Right Ear: External ear normal.     Left Ear: External ear normal.     Nose: Nose normal.     Mouth/Throat:     Mouth: Mucous membranes are moist.     Pharynx: Oropharynx is clear.  Eyes:     Conjunctiva/sclera: Conjunctivae normal.  Cardiovascular:     Rate and Rhythm: Normal rate and regular rhythm.     Pulses: Normal pulses.     Heart sounds: Normal heart sounds.  Pulmonary:     Effort: Pulmonary effort is normal.     Breath sounds: Normal breath sounds.  Abdominal:     General: Abdomen is flat.  Musculoskeletal:     Cervical back: Normal range of motion.     Comments: She has good range of motion the cervical spine. Motor is 5/5 in the upper extremities except for abduction limited to intrinsic shoulder  pain she has a positive impingement sign positive second impingement sign. Slightly decreased internal rotation.  Skin:    General: Skin is warm and dry.  Neurological:     Mental Status: She is alert.    MRI of the cervical spine demonstrates mild cervical central stenosis at C3-4 and C4-5. Severe right foraminal stenosis at C5-6.  MRI of the right shoulder demonstrates an undersurface supraspinatus tear medial to the footprint involving the entire tendon thickness. 12 mm x 12 mm. Mild glenohumeral arthrosis and AC arthrosis. And bursitis  Assessment/Plan Impression:  1. Impingement syndrome of the right shoulder with a small high-grade  tear of the supraspinatus 2. Mild C6 radiculopathy secondary to cervical foraminal stenosis C5-6  Plan:  We discussed options living with her symptoms versus rotator cuff repair and subacromial decompression she would like to proceed with the latter An extensive discussion concerning the pathology relevant anatomy and treatment options. After that discussion we mutually agreed to proceed with repair of the rotator cuff utilizing arthroscopic assistance if possible. The risks and benefits of that procedure were discussed including bleeding, infection, suboptimal range of motion, deep venous thrombosis, pulmonary embolism, anesthetic complications etc. in addition we discussed the postoperative course to include approximately 4 weeks of passive range of motion followed by 4 weeks of active range of motion followed by 4-12 weeks of progressive strengthening exercises. In addition we discussed protective activities to reduce the risk of a reinjury including impingement activities with elbow above the shoulder as well as reaching and repetitive circular motion activities. The hospital stay will either be as a outpatient with a regional block versus overnight depending upon the extent of the procedure and any challenging health issues with a first postoperative visit 2 weeks following the surgery.   We discussed a home exercise program as well as strategies to avoid impingement such as avoiding activities with the elbow above the shoulder, behind the back, repetitive circular motions and overhead lifting.  For her cervical spine we discussed modification of activity I had a discussion with the patient concerning the anatomy and biomechanics of the cervical spine. In particular how to avoid positional neural compression by reducing neck extension during activities such as driving, sleeping, sitting at the computer and other activities. In addition to avoid prolonged flexion of the neck that typically aggravates  the paraspinous musculature. Regular neck exercises would be beneficial as well.  For nighttime pain continue her tramadol A prescription for an opioid was given to be taken as directed for pain control. I discussed the risks including cognitive changes which may affect the ability to operate machinery and to drive. In addition the side effect of constipation as well as to avoid taking with conflicting medications that were discussed. The patient's prescription drug monitoring report was reviewed with no red flags noted.  Follow-up in 2 weeks we will discuss surgery at that time if indicated  No history of DVT or MRSA. Patient has allergy to lidocaine which produces hives.  Plan right shoulder scope, SAD, mini-open RCR  Dorothy Spark, PA-C for Dr Shelle Iron 06/01/2022, 11:46 AM

## 2022-06-01 NOTE — H&P (View-Only) (Signed)
Angie Fisher is an 67 y.o. female.   Chief Complaint: right shoulder pain HPI: Reason for Visit: (normal) visit for: (neck and right shoulder); 9 weeks Location (Upper Extremity): cervical spine pain on the right, ; right shoulder and upper arm pain Severity: pain level 8/10 Quality: aching Aggravating Factors: ROM, lifting, and crossing arms aggravate Alleviating Factors: 05/01/22 cortisone injection right shoulder helped for about 3 days Associated Symptoms: no numbness/tingling; no weakness Medications: helping a little; Tramadol qhs She has been doing her home exercise program that is painful she is not able to do her activities including gardening. This has been going on since March. Only 3 days of relief from her injection. Taking tramadol at night  Past Medical History:  Diagnosis Date   Anxiety    Arthritis    Colon polyp    Glaucoma    History of melanoma    Hyperlipidemia    Vitamin D deficiency     Past Surgical History:  Procedure Laterality Date   APPENDECTOMY     CHOLECYSTECTOMY     HERNIA REPAIR     PARTIAL HYSTERECTOMY     SKIN CANCER EXCISION      Family History  Problem Relation Age of Onset   Hypertension Mother    Diabetes Mother    Dementia Mother    Glaucoma Mother    Bone cancer Father    Social History:  reports that she has quit smoking. Her smoking use included cigarettes. She has a 3.25 pack-year smoking history. She has never used smokeless tobacco. She reports current alcohol use. She reports that she does not use drugs.  Allergies:  Allergies  Allergen Reactions   Lidocaine Hives and Rash   Sulfonamide Derivatives     REACTION: Nausea/vomiting   Zoster Vac Recomb Adjuvanted     Other reaction(s): increase HR, arm numbness   Current meds: erythromycin 5 mg/gram (0.5 %) eye ointment neomycin-bacitracin-poly-HC 3.5 mg-400-10,000 unit/g-1 % eye ointment simvastatin 40 mg tablet timoloL maleate 0.5 % eye drops traMADoL 50 mg  tablet Vitamin D3 50 mcg (2,000 unit) tablet  Review of Systems  Constitutional: Negative.   HENT: Negative.    Eyes: Negative.   Respiratory: Negative.    Cardiovascular: Negative.   Gastrointestinal: Negative.   Endocrine: Negative.   Genitourinary: Negative.   Musculoskeletal:  Positive for arthralgias and myalgias.  Neurological: Negative.   Psychiatric/Behavioral: Negative.      There were no vitals taken for this visit. Physical Exam Constitutional:      Appearance: Normal appearance.  HENT:     Head: Normocephalic.     Right Ear: External ear normal.     Left Ear: External ear normal.     Nose: Nose normal.     Mouth/Throat:     Mouth: Mucous membranes are moist.     Pharynx: Oropharynx is clear.  Eyes:     Conjunctiva/sclera: Conjunctivae normal.  Cardiovascular:     Rate and Rhythm: Normal rate and regular rhythm.     Pulses: Normal pulses.     Heart sounds: Normal heart sounds.  Pulmonary:     Effort: Pulmonary effort is normal.     Breath sounds: Normal breath sounds.  Abdominal:     General: Abdomen is flat.  Musculoskeletal:     Cervical back: Normal range of motion.     Comments: She has good range of motion the cervical spine. Motor is 5/5 in the upper extremities except for abduction limited to intrinsic shoulder   pain she has a positive impingement sign positive second impingement sign. Slightly decreased internal rotation.  Skin:    General: Skin is warm and dry.  Neurological:     Mental Status: She is alert.    MRI of the cervical spine demonstrates mild cervical central stenosis at C3-4 and C4-5. Severe right foraminal stenosis at C5-6.  MRI of the right shoulder demonstrates an undersurface supraspinatus tear medial to the footprint involving the entire tendon thickness. 12 mm x 12 mm. Mild glenohumeral arthrosis and AC arthrosis. And bursitis  Assessment/Plan Impression:  1. Impingement syndrome of the right shoulder with a small high-grade  tear of the supraspinatus 2. Mild C6 radiculopathy secondary to cervical foraminal stenosis C5-6  Plan:  We discussed options living with her symptoms versus rotator cuff repair and subacromial decompression she would like to proceed with the latter An extensive discussion concerning the pathology relevant anatomy and treatment options. After that discussion we mutually agreed to proceed with repair of the rotator cuff utilizing arthroscopic assistance if possible. The risks and benefits of that procedure were discussed including bleeding, infection, suboptimal range of motion, deep venous thrombosis, pulmonary embolism, anesthetic complications etc. in addition we discussed the postoperative course to include approximately 4 weeks of passive range of motion followed by 4 weeks of active range of motion followed by 4-12 weeks of progressive strengthening exercises. In addition we discussed protective activities to reduce the risk of a reinjury including impingement activities with elbow above the shoulder as well as reaching and repetitive circular motion activities. The hospital stay will either be as a outpatient with a regional block versus overnight depending upon the extent of the procedure and any challenging health issues with a first postoperative visit 2 weeks following the surgery.   We discussed a home exercise program as well as strategies to avoid impingement such as avoiding activities with the elbow above the shoulder, behind the back, repetitive circular motions and overhead lifting.  For her cervical spine we discussed modification of activity I had a discussion with the patient concerning the anatomy and biomechanics of the cervical spine. In particular how to avoid positional neural compression by reducing neck extension during activities such as driving, sleeping, sitting at the computer and other activities. In addition to avoid prolonged flexion of the neck that typically aggravates  the paraspinous musculature. Regular neck exercises would be beneficial as well.  For nighttime pain continue her tramadol A prescription for an opioid was given to be taken as directed for pain control. I discussed the risks including cognitive changes which may affect the ability to operate machinery and to drive. In addition the side effect of constipation as well as to avoid taking with conflicting medications that were discussed. The patient's prescription drug monitoring report was reviewed with no red flags noted.  Follow-up in 2 weeks we will discuss surgery at that time if indicated  No history of DVT or MRSA. Patient has allergy to lidocaine which produces hives.  Plan right shoulder scope, SAD, mini-open RCR  Lue [place] M Ajax Schroll, PA-C for Dr Beane 06/01/2022, 11:46 AM    

## 2022-06-13 NOTE — Anesthesia Preprocedure Evaluation (Signed)
Anesthesia Evaluation  Patient identified by MRN, date of birth, ID band Patient awake    Reviewed: Allergy & Precautions, NPO status , Patient's Chart, lab work & pertinent test results  Airway Mallampati: I  TM Distance: >3 FB Neck ROM: Full    Dental no notable dental hx. (+) Teeth Intact, Dental Advisory Given   Pulmonary former smoker, neg PE   Pulmonary exam normal breath sounds clear to auscultation       Cardiovascular Normal cardiovascular exam Rhythm:Regular Rate:Normal  2022 echo Left ventricular ejection fraction, by estimation, is 60 to 65%   Neuro/Psych  PSYCHIATRIC DISORDERS Anxiety        GI/Hepatic   Endo/Other    Renal/GU      Musculoskeletal  (+) Arthritis ,    Abdominal   Peds  Hematology   Anesthesia Other Findings All: Sulfa  Hx of glaucoma   Reproductive/Obstetrics                              Anesthesia Physical Anesthesia Plan  ASA: 2  Anesthesia Plan: General   Post-op Pain Management: Regional block* and Minimal or no pain anticipated   Induction: Intravenous  PONV Risk Score and Plan: 4 or greater and Treatment may vary due to age or medical condition, Midazolam and Ondansetron  Airway Management Planned: Oral ETT  Additional Equipment: None  Intra-op Plan:   Post-operative Plan: Extubation in OR  Informed Consent:      Dental advisory given  Plan Discussed with:   Anesthesia Plan Comments: (GA w R ISB w exparel)         Anesthesia Quick Evaluation

## 2022-06-14 ENCOUNTER — Other Ambulatory Visit: Payer: Self-pay

## 2022-06-14 ENCOUNTER — Ambulatory Visit (HOSPITAL_BASED_OUTPATIENT_CLINIC_OR_DEPARTMENT_OTHER): Payer: Commercial Managed Care - PPO | Admitting: Anesthesiology

## 2022-06-14 ENCOUNTER — Ambulatory Visit (HOSPITAL_COMMUNITY): Payer: Commercial Managed Care - PPO | Admitting: Anesthesiology

## 2022-06-14 ENCOUNTER — Other Ambulatory Visit (HOSPITAL_BASED_OUTPATIENT_CLINIC_OR_DEPARTMENT_OTHER): Payer: Self-pay

## 2022-06-14 ENCOUNTER — Encounter (HOSPITAL_COMMUNITY): Payer: Self-pay | Admitting: Specialist

## 2022-06-14 ENCOUNTER — Ambulatory Visit (HOSPITAL_COMMUNITY)
Admission: RE | Admit: 2022-06-14 | Discharge: 2022-06-14 | Disposition: A | Discharge status: Home / Self Care | Payer: Commercial Managed Care - PPO | Source: Ambulatory Visit | Attending: Specialist | Admitting: Specialist

## 2022-06-14 ENCOUNTER — Encounter (HOSPITAL_COMMUNITY)
Admission: RE | Disposition: A | Discharge status: Home / Self Care | Payer: Self-pay | Source: Ambulatory Visit | Attending: Specialist

## 2022-06-14 DIAGNOSIS — M659 Synovitis and tenosynovitis, unspecified: Secondary | ICD-10-CM | POA: Diagnosis not present

## 2022-06-14 DIAGNOSIS — M7551 Bursitis of right shoulder: Secondary | ICD-10-CM | POA: Diagnosis not present

## 2022-06-14 DIAGNOSIS — S46011A Strain of muscle(s) and tendon(s) of the rotator cuff of right shoulder, initial encounter: Secondary | ICD-10-CM | POA: Diagnosis not present

## 2022-06-14 DIAGNOSIS — Z87891 Personal history of nicotine dependence: Secondary | ICD-10-CM | POA: Insufficient documentation

## 2022-06-14 DIAGNOSIS — M75111 Incomplete rotator cuff tear or rupture of right shoulder, not specified as traumatic: Secondary | ICD-10-CM | POA: Insufficient documentation

## 2022-06-14 DIAGNOSIS — X58XXXA Exposure to other specified factors, initial encounter: Secondary | ICD-10-CM | POA: Diagnosis not present

## 2022-06-14 DIAGNOSIS — M75101 Unspecified rotator cuff tear or rupture of right shoulder, not specified as traumatic: Secondary | ICD-10-CM

## 2022-06-14 DIAGNOSIS — M4802 Spinal stenosis, cervical region: Secondary | ICD-10-CM | POA: Insufficient documentation

## 2022-06-14 DIAGNOSIS — M4722 Other spondylosis with radiculopathy, cervical region: Secondary | ICD-10-CM | POA: Diagnosis not present

## 2022-06-14 DIAGNOSIS — F419 Anxiety disorder, unspecified: Secondary | ICD-10-CM | POA: Diagnosis not present

## 2022-06-14 DIAGNOSIS — M65811 Other synovitis and tenosynovitis, right shoulder: Secondary | ICD-10-CM | POA: Diagnosis not present

## 2022-06-14 DIAGNOSIS — S4381XA Sprain of other specified parts of right shoulder girdle, initial encounter: Secondary | ICD-10-CM | POA: Diagnosis not present

## 2022-06-14 DIAGNOSIS — M7541 Impingement syndrome of right shoulder: Secondary | ICD-10-CM | POA: Diagnosis not present

## 2022-06-14 DIAGNOSIS — M7521 Bicipital tendinitis, right shoulder: Secondary | ICD-10-CM | POA: Diagnosis not present

## 2022-06-14 DIAGNOSIS — M19011 Primary osteoarthritis, right shoulder: Secondary | ICD-10-CM | POA: Diagnosis not present

## 2022-06-14 DIAGNOSIS — M94211 Chondromalacia, right shoulder: Secondary | ICD-10-CM | POA: Diagnosis not present

## 2022-06-14 HISTORY — PX: SHOULDER ARTHROSCOPY WITH ROTATOR CUFF REPAIR AND SUBACROMIAL DECOMPRESSION: SHX5686

## 2022-06-14 SURGERY — SHOULDER ARTHROSCOPY WITH ROTATOR CUFF REPAIR AND SUBACROMIAL DECOMPRESSION
Anesthesia: General | Laterality: Right

## 2022-06-14 MED ORDER — BUPIVACAINE LIPOSOME 1.3 % IJ SUSP
INTRAMUSCULAR | Status: DC | PRN
  Administered 2022-06-14: 10 mL via PERINEURAL

## 2022-06-14 MED ORDER — DEXAMETHASONE SODIUM PHOSPHATE 10 MG/ML IJ SOLN
INTRAMUSCULAR | Status: AC
  Filled 2022-06-14: qty 1

## 2022-06-14 MED ORDER — OXYCODONE HCL 5 MG PO TABS
5.0000 mg | ORAL_TABLET | Freq: Once | ORAL | Status: AC | PRN
  Administered 2022-06-14: 5 mg via ORAL

## 2022-06-14 MED ORDER — DOCUSATE SODIUM 100 MG PO CAPS
100.0000 mg | ORAL_CAPSULE | Freq: Two times a day (BID) | ORAL | 1 refills | Status: DC | PRN
  Filled 2022-06-14: qty 100, 50d supply, fill #0

## 2022-06-14 MED ORDER — METHOCARBAMOL 750 MG PO TABS
750.0000 mg | ORAL_TABLET | Freq: Three times a day (TID) | ORAL | 1 refills | Status: DC | PRN
  Filled 2022-06-14: qty 30, 10d supply, fill #0

## 2022-06-14 MED ORDER — MIDAZOLAM HCL 2 MG/2ML IJ SOLN
1.0000 mg | INTRAMUSCULAR | Status: DC
  Administered 2022-06-14: 1 mg via INTRAVENOUS
  Filled 2022-06-14: qty 2

## 2022-06-14 MED ORDER — ASPIRIN 81 MG PO TBEC
81.0000 mg | DELAYED_RELEASE_TABLET | Freq: Every day | ORAL | 0 refills | Status: AC
  Filled 2022-06-14: qty 13, 13d supply, fill #0

## 2022-06-14 MED ORDER — CHLORHEXIDINE GLUCONATE 0.12 % MT SOLN
15.0000 mL | Freq: Once | OROMUCOSAL | Status: AC
  Administered 2022-06-14: 15 mL via OROMUCOSAL

## 2022-06-14 MED ORDER — SODIUM CHLORIDE 0.9 % IR SOLN
Status: DC | PRN
  Administered 2022-06-14: 6000 mL

## 2022-06-14 MED ORDER — EPHEDRINE SULFATE-NACL 50-0.9 MG/10ML-% IV SOSY
PREFILLED_SYRINGE | INTRAVENOUS | Status: DC | PRN
  Administered 2022-06-14: 5 mg via INTRAVENOUS
  Administered 2022-06-14: 10 mg via INTRAVENOUS

## 2022-06-14 MED ORDER — ONDANSETRON HCL 4 MG/2ML IJ SOLN: 4.0000 mg | Freq: Once | INTRAMUSCULAR | Status: DC | PRN

## 2022-06-14 MED ORDER — OXYCODONE HCL 5 MG PO TABS
5.0000 mg | ORAL_TABLET | ORAL | 0 refills | Status: DC | PRN
  Filled 2022-06-14: qty 40, 7d supply, fill #0

## 2022-06-14 MED ORDER — ONDANSETRON HCL 4 MG/2ML IJ SOLN
INTRAMUSCULAR | Status: DC | PRN
  Administered 2022-06-14: 4 mg via INTRAVENOUS

## 2022-06-14 MED ORDER — SUGAMMADEX SODIUM 200 MG/2ML IV SOLN
INTRAVENOUS | Status: DC | PRN
  Administered 2022-06-14: 200 mg via INTRAVENOUS

## 2022-06-14 MED ORDER — PHENYLEPHRINE 80 MCG/ML (10ML) SYRINGE FOR IV PUSH (FOR BLOOD PRESSURE SUPPORT)
PREFILLED_SYRINGE | INTRAVENOUS | Status: AC
  Filled 2022-06-14: qty 10

## 2022-06-14 MED ORDER — PROPOFOL 10 MG/ML IV BOLUS
INTRAVENOUS | Status: AC
  Filled 2022-06-14: qty 20

## 2022-06-14 MED ORDER — CEFAZOLIN SODIUM-DEXTROSE 2-4 GM/100ML-% IV SOLN
2.0000 g | INTRAVENOUS | Status: AC
  Administered 2022-06-14: 2 g via INTRAVENOUS
  Filled 2022-06-14: qty 100

## 2022-06-14 MED ORDER — ORAL CARE MOUTH RINSE: 15.0000 mL | Freq: Once | OROMUCOSAL | Status: AC

## 2022-06-14 MED ORDER — OXYCODONE HCL 5 MG/5ML PO SOLN: 5.0000 mg | Freq: Once | ORAL | Status: AC | PRN

## 2022-06-14 MED ORDER — POLYETHYLENE GLYCOL 3350 17 GM/SCOOP PO POWD
17.0000 g | Freq: Every day | ORAL | 0 refills | Status: DC
  Filled 2022-06-14: qty 238, 14d supply, fill #0

## 2022-06-14 MED ORDER — AMISULPRIDE (ANTIEMETIC) 5 MG/2ML IV SOLN: 10.0000 mg | Freq: Once | INTRAVENOUS | Status: DC | PRN

## 2022-06-14 MED ORDER — PROPOFOL 10 MG/ML IV BOLUS
INTRAVENOUS | Status: DC | PRN
  Administered 2022-06-14: 130 mg via INTRAVENOUS

## 2022-06-14 MED ORDER — ONDANSETRON HCL 4 MG/2ML IJ SOLN
INTRAMUSCULAR | Status: AC
  Filled 2022-06-14: qty 2

## 2022-06-14 MED ORDER — EPINEPHRINE PF 1 MG/ML IJ SOLN
INTRAMUSCULAR | Status: AC
  Filled 2022-06-14: qty 3

## 2022-06-14 MED ORDER — TRANEXAMIC ACID-NACL 1000-0.7 MG/100ML-% IV SOLN
1000.0000 mg | INTRAVENOUS | Status: AC
  Administered 2022-06-14: 1000 mg via INTRAVENOUS
  Filled 2022-06-14: qty 100

## 2022-06-14 MED ORDER — LIDOCAINE HCL (PF) 2 % IJ SOLN
INTRAMUSCULAR | Status: AC
  Filled 2022-06-14: qty 5

## 2022-06-14 MED ORDER — BUPIVACAINE-EPINEPHRINE 0.5% -1:200000 IJ SOLN
INTRAMUSCULAR | Status: DC | PRN
  Administered 2022-06-14: 8 mL

## 2022-06-14 MED ORDER — PHENYLEPHRINE 80 MCG/ML (10ML) SYRINGE FOR IV PUSH (FOR BLOOD PRESSURE SUPPORT)
PREFILLED_SYRINGE | INTRAVENOUS | Status: DC | PRN
  Administered 2022-06-14: 160 ug via INTRAVENOUS

## 2022-06-14 MED ORDER — ROCURONIUM BROMIDE 10 MG/ML (PF) SYRINGE
PREFILLED_SYRINGE | INTRAVENOUS | Status: AC
  Filled 2022-06-14: qty 10

## 2022-06-14 MED ORDER — LACTATED RINGERS IV SOLN: INTRAVENOUS | Status: DC

## 2022-06-14 MED ORDER — FENTANYL CITRATE (PF) 100 MCG/2ML IJ SOLN
INTRAMUSCULAR | Status: AC
  Filled 2022-06-14: qty 2

## 2022-06-14 MED ORDER — OXYCODONE HCL 5 MG PO TABS
ORAL_TABLET | ORAL | Status: AC
  Filled 2022-06-14: qty 1

## 2022-06-14 MED ORDER — FENTANYL CITRATE (PF) 100 MCG/2ML IJ SOLN
INTRAMUSCULAR | Status: DC | PRN
  Administered 2022-06-14: 100 ug via INTRAVENOUS

## 2022-06-14 MED ORDER — ROCURONIUM BROMIDE 10 MG/ML (PF) SYRINGE
PREFILLED_SYRINGE | INTRAVENOUS | Status: DC | PRN
  Administered 2022-06-14: 10 mg via INTRAVENOUS
  Administered 2022-06-14: 20 mg via INTRAVENOUS
  Administered 2022-06-14: 40 mg via INTRAVENOUS

## 2022-06-14 MED ORDER — FENTANYL CITRATE PF 50 MCG/ML IJ SOSY
50.0000 ug | PREFILLED_SYRINGE | INTRAMUSCULAR | Status: DC
  Administered 2022-06-14: 50 ug via INTRAVENOUS
  Filled 2022-06-14: qty 2

## 2022-06-14 MED ORDER — ACETAMINOPHEN 10 MG/ML IV SOLN
1000.0000 mg | Freq: Once | INTRAVENOUS | Status: DC | PRN
  Administered 2022-06-14: 1000 mg via INTRAVENOUS

## 2022-06-14 MED ORDER — DEXAMETHASONE SODIUM PHOSPHATE 10 MG/ML IJ SOLN
INTRAMUSCULAR | Status: DC | PRN
  Administered 2022-06-14: 10 mg via INTRAVENOUS

## 2022-06-14 MED ORDER — EPHEDRINE 5 MG/ML INJ
INTRAVENOUS | Status: AC
  Filled 2022-06-14: qty 5

## 2022-06-14 MED ORDER — BUPIVACAINE HCL (PF) 0.5 % IJ SOLN
INTRAMUSCULAR | Status: AC
  Filled 2022-06-14: qty 30

## 2022-06-14 MED ORDER — BUPIVACAINE HCL (PF) 0.5 % IJ SOLN
INTRAMUSCULAR | Status: DC | PRN
  Administered 2022-06-14: 15 mL via PERINEURAL

## 2022-06-14 MED ORDER — ACETAMINOPHEN 10 MG/ML IV SOLN
INTRAVENOUS | Status: AC
  Filled 2022-06-14: qty 100

## 2022-06-14 MED ORDER — PHENYLEPHRINE HCL-NACL 20-0.9 MG/250ML-% IV SOLN
INTRAVENOUS | Status: DC | PRN
  Administered 2022-06-14: 50 ug/min via INTRAVENOUS

## 2022-06-14 MED ORDER — HYDROMORPHONE HCL 1 MG/ML IJ SOLN: 0.2500 mg | INTRAMUSCULAR | Status: DC | PRN

## 2022-06-14 SURGICAL SUPPLY — 75 items
AID PSTN UNV HD RSTRNT DISP (MISCELLANEOUS) ×1
ANCH SUT 2 SWLK 19.1 CLS EYLT (Anchor) ×1 IMPLANT
ANCH SUT FBRTAPE 1.3X2.6X1.7 (Anchor) ×1 IMPLANT
ANCHOR NDL 9/16 CIR SZ 8 (NEEDLE) IMPLANT
ANCHOR NEEDLE 9/16 CIR SZ 8 (NEEDLE) IMPLANT
ANCHOR PEEK 4.75X19.1 SWLK C (Anchor) IMPLANT
ANCHOR PEEK SWIVEL LOCK 5.5 (Anchor) IMPLANT
ANCHOR SUT FBRTK 2.6 SOFT 1.7 (Anchor) IMPLANT
ANCHOR SWIVELOCK BIO 4.75X19.1 (Anchor) IMPLANT
BAG COUNTER SPONGE SURGICOUNT (BAG) IMPLANT
BAG SPNG CNTER NS LX DISP (BAG)
BLADE SHAVER TORPEDO 4X13 (MISCELLANEOUS) ×1 IMPLANT
BLADE SURG SZ11 CARB STEEL (BLADE) ×1 IMPLANT
BURR OVAL 8 FLU 4.0X13 (MISCELLANEOUS) IMPLANT
CANNULA 5.75X7 CRYSTAL CLEAR (CANNULA) IMPLANT
CANNULA ACUFO 5X76 (CANNULA) IMPLANT
CLEANER TIP ELECTROSURG 2X2 (MISCELLANEOUS) IMPLANT
COVER SURGICAL LIGHT HANDLE (MISCELLANEOUS) ×1 IMPLANT
DRAPE FOOT SWITCH (DRAPES) ×2 IMPLANT
DRAPE IMP U-DRAPE 54X76 (DRAPES) ×1 IMPLANT
DRAPE ORTHO SPLIT 77X108 STRL (DRAPES) ×2
DRAPE POUCH INSTRU U-SHP 10X18 (DRAPES) ×1 IMPLANT
DRAPE STERI 35X30 U-POUCH (DRAPES) ×1 IMPLANT
DRAPE SURG ORHT 6 SPLT 77X108 (DRAPES) ×2 IMPLANT
DRSG AQUACEL AG ADV 3.5X 4 (GAUZE/BANDAGES/DRESSINGS) IMPLANT
DRSG AQUACEL AG ADV 3.5X 6 (GAUZE/BANDAGES/DRESSINGS) IMPLANT
DURAPREP 26ML APPLICATOR (WOUND CARE) ×1 IMPLANT
DW OUTFLOW CASSETTE/TUBE SET (MISCELLANEOUS) ×1 IMPLANT
ELECT NDL TIP 2.8 STRL (NEEDLE) ×1 IMPLANT
ELECT NEEDLE TIP 2.8 STRL (NEEDLE) ×1 IMPLANT
ELECT REM PT RETURN 15FT ADLT (MISCELLANEOUS) ×1 IMPLANT
FILTER STRAW (MISCELLANEOUS) ×1 IMPLANT
GAUZE PAD ABD 8X10 STRL (GAUZE/BANDAGES/DRESSINGS) IMPLANT
GLOVE BIOGEL PI IND STRL 7.0 (GLOVE) ×1 IMPLANT
GLOVE BIOGEL PI IND STRL 8 (GLOVE) ×1 IMPLANT
GLOVE SURG SS PI 8.0 STRL IVOR (GLOVE) ×2 IMPLANT
GOWN STRL REUS W/ TWL XL LVL3 (GOWN DISPOSABLE) ×2 IMPLANT
GOWN STRL REUS W/TWL XL LVL3 (GOWN DISPOSABLE) ×2
KIT BASIN OR (CUSTOM PROCEDURE TRAY) ×1 IMPLANT
KIT TURNOVER KIT A (KITS) IMPLANT
MANIFOLD NEPTUNE II (INSTRUMENTS) ×1 IMPLANT
NDL HD SCORPION MEGA LOADER (NEEDLE) IMPLANT
NDL SCORPION MULTI FIRE (NEEDLE) IMPLANT
NDL SPNL 18GX3.5 QUINCKE PK (NEEDLE) ×1 IMPLANT
NEEDLE SCORPION MULTI FIRE (NEEDLE) IMPLANT
NEEDLE SPNL 18GX3.5 QUINCKE PK (NEEDLE) ×1 IMPLANT
PACK SHOULDER (CUSTOM PROCEDURE TRAY) ×1 IMPLANT
PORT APPOLLO RF 90DEGREE MULTI (SURGICAL WAND) ×1 IMPLANT
PROTECTOR NERVE ULNAR (MISCELLANEOUS) ×1 IMPLANT
RESTRAINT HEAD UNIVERSAL NS (MISCELLANEOUS) ×1 IMPLANT
SLING ARM FOAM STRAP MED (SOFTGOODS) IMPLANT
SLING ARM IMMOBILIZER LRG (SOFTGOODS) IMPLANT
SLING ARM IMMOBILIZER MED (SOFTGOODS) IMPLANT
SLING ULTRA II L (ORTHOPEDIC SUPPLIES) IMPLANT
STRIP CLOSURE SKIN 1/2X4 (GAUZE/BANDAGES/DRESSINGS) IMPLANT
SUCTION TUBE FRAZIER 12FR DISP (SUCTIONS) ×1 IMPLANT
SUT ETHIBOND NAB CT1 #1 30IN (SUTURE) IMPLANT
SUT ETHILON 4 0 PS 2 18 (SUTURE) ×1 IMPLANT
SUT FIBERWIRE #2 38 T-5 BLUE (SUTURE)
SUT PDS AB 0 CTX 36 PDP370T (SUTURE) IMPLANT
SUT PROLENE 3 0 PS 2 (SUTURE) ×1 IMPLANT
SUT TIGER TAPE 7 IN WHITE (SUTURE) IMPLANT
SUT VIC AB 0 CT1 27 (SUTURE) ×1
SUT VIC AB 0 CT1 27XBRD ANTBC (SUTURE) ×1 IMPLANT
SUT VIC AB 1-0 CT2 27 (SUTURE) IMPLANT
SUT VIC AB 2-0 CT2 27 (SUTURE) IMPLANT
SUT VICRYL 0 UR6 27IN ABS (SUTURE) ×1 IMPLANT
SUTURE FIBERWR #2 38 T-5 BLUE (SUTURE) IMPLANT
SYR 20ML LL LF (SYRINGE) ×1 IMPLANT
TAPE FIBER 2MM 7IN #2 BLUE (SUTURE) IMPLANT
TOWEL OR 17X26 10 PK STRL BLUE (TOWEL DISPOSABLE) ×1 IMPLANT
TUBING ARTHROSCOPY IRRIG 16FT (MISCELLANEOUS) ×1 IMPLANT
TUBING CONNECTING 10 (TUBING) ×1 IMPLANT
WAND ABLATOR APOLLO I90 (BUR) IMPLANT
WIPE CHG 2% PREP (PERSONAL CARE ITEMS) ×1 IMPLANT

## 2022-06-14 NOTE — Brief Op Note (Addendum)
06/14/2022  2:08 PM  PATIENT:  Priscille Loveless Gargan  67 y.o. female  PRE-OPERATIVE DIAGNOSIS:  Right rotator cuff tear  POST-OPERATIVE DIAGNOSIS:  Right rotator cuff tear  PROCEDURE:  Procedure(s) with comments: RIGHT SHOULDER ARTHROSCOPY WITH MINI OPEN ROTATOR CUFF REPAIR AND SUBACROMIAL DECOMPRESSION (Right) - 90 MINS  DIAGNOSES: Right shoulder, acute traumatic rotator cuff tear, biceps tendinitis, and subacromial impingement.  POST-OPERATIVE DIAGNOSIS: same,  labral tear  PROCEDURE: Open repair acute rotator cuff tear - 16109 Arthroscopic extensive debridement - 29823 Subdeltoid Bursa, Supraspinatus Tendon, Anterior Labrum, and Superior Labrum   OPERATIVE FINDING: Exam under anesthesia: Normal Articular space:  grade 3 Chondral surfaces:  grade 3 Biceps:  tendonosis Subscapularis: Intact intact Supraspinatus: Incomplete tear 90% Infraspinatus: Intact intact   SURGEON:  Surgeon(s) and Role:    Jene Every, MD - Primary  PHYSICIAN ASSISTANT:   ASSISTANTS: Bissell   ANESTHESIA:   general  EBL:  10 min  BLOOD ADMINISTERED:none  DRAINS: none   LOCAL MEDICATIONS USED:  MARCAINE     SPECIMEN:  No Specimen  DISPOSITION OF SPECIMEN:  N/A  COUNTS:  yes  TOURNIQUET:  * No tourniquets in log *  DICTATION: .Other Dictation: Dictation Number   [id number]  PLAN OF CARE: Discharge to home after PACU  PATIENT DISPOSITION:  PACU - hemodynamically stable.   Delay start of Pharmacological VTE agent (>24hrs) due to surgical blood loss or risk of bleeding: no

## 2022-06-14 NOTE — Anesthesia Procedure Notes (Signed)
Procedure Name: Intubation Date/Time: 06/14/2022 12:31 PM  Performed by: Orest Dikes, CRNAPre-anesthesia Checklist: Patient identified, Emergency Drugs available, Suction available and Patient being monitored Patient Re-evaluated:Patient Re-evaluated prior to induction Oxygen Delivery Method: Circle system utilized Preoxygenation: Pre-oxygenation with 100% oxygen Induction Type: IV induction Ventilation: Mask ventilation without difficulty Laryngoscope Size: Mac and 4 Grade View: Grade I Tube type: Oral Tube size: 7.0 mm Number of attempts: 1 Airway Equipment and Method: Stylet Placement Confirmation: ETT inserted through vocal cords under direct vision, positive ETCO2 and breath sounds checked- equal and bilateral Secured at: 21 cm Tube secured with: Tape Dental Injury: Teeth and Oropharynx as per pre-operative assessment

## 2022-06-14 NOTE — Transfer of Care (Signed)
Immediate Anesthesia Transfer of Care Note  Patient: Angie Fisher  Procedure(s) Performed: RIGHT SHOULDER ARTHROSCOPY WITH MINI OPEN ROTATOR CUFF REPAIR AND SUBACROMIAL DECOMPRESSION (Right)  Patient Location: PACU  Anesthesia Type:General  Level of Consciousness: awake, alert , and oriented  Airway & Oxygen Therapy: Patient Spontanous Breathing and Patient connected to face mask oxygen  Post-op Assessment: Report given to RN and Post -op Vital signs reviewed and stable  Post vital signs: Reviewed and stable  Last Vitals:  Vitals Value Taken Time  BP    Temp    Pulse 85 06/14/22 1424  Resp 16 06/14/22 1424  SpO2 100 % 06/14/22 1424  Vitals shown include unvalidated device data.  Last Pain:  Vitals:   06/14/22 0910  TempSrc: Oral  PainSc: 0-No pain         Complications: No notable events documented.

## 2022-06-14 NOTE — Anesthesia Procedure Notes (Signed)
Anesthesia Regional Block: Interscalene brachial plexus block   Pre-Anesthetic Checklist: , timeout performed,  Correct Patient, Correct Site, Correct Laterality,  Correct Procedure, Correct Position, site marked,  Risks and benefits discussed,  Surgical consent,  Pre-op evaluation,  At surgeon's request and post-op pain management  Laterality: Upper and Right  Prep: Maximum Sterile Barrier Precautions used, chloraprep       Needles:  Injection technique: Single-shot  Needle Type: Echogenic Needle     Needle Length: 5cm  Needle Gauge: 21     Additional Needles:   Procedures:,,,, ultrasound used (permanent image in chart),,    Narrative:  Start time: 06/14/2022 10:45 AM End time: 06/14/2022 10:51 AM Injection made incrementally with aspirations every 5 mL.  Performed by: Personally  Anesthesiologist: Trevor Iha, MD  Additional Notes: Block assessed prior to procedure. Patient tolerated procedure well.

## 2022-06-14 NOTE — Interval H&P Note (Signed)
History and Physical Interval Note:  06/14/2022 10:58 AM  Angie Fisher  has presented today for surgery, with the diagnosis of Right rotator cuff tear.  The various methods of treatment have been discussed with the patient and family. After consideration of risks, benefits and other options for treatment, the patient has consented to  Procedure(s) with comments: RIGHT SHOULDER ARTHROSCOPY WITH MINI OPEN ROTATOR CUFF REPAIR AND SUBACROMIAL DECOMPRESSION (Right) - 90 MINS as a surgical intervention.  The patient's history has been reviewed, patient examined, no change in status, stable for surgery.  I have reviewed the patient's chart and labs.  Questions were answered to the patient's satisfaction.     Javier Docker

## 2022-06-14 NOTE — Op Note (Signed)
Angie Fisher, Fisher MEDICAL RECORD NO: [id number] ACCOUNT NO: [id number] DATE OF BIRTH: 10-04-1955 FACILITY: [facility] LOCATION: [facility] PHYSICIAN: Javier Docker, MD  Operative Report   DATE OF PROCEDURE: 06/14/2022  PREOPERATIVE DIAGNOSES:  Rotator cuff tear, right shoulder.  POSTOPERATIVE DIAGNOSES:   1.  Rotator cuff tear, supraspinatus, right shoulder. 2.  Anterior superior labral tear. 3.  Bicipital tendinosis. 4.  Glenohumeral arthrosis. 5.  Subdeltoid and subacromial bursitis.  PROCEDURE PERFORMED:   1.  Right shoulder arthroscopy. 2.  Extensive debridement that included the anterior superior labrum, supraspinatus tendon, subdeltoid and subacromial bursa, synovitis, glenohumeral joint. 3.  Mini open rotator cuff repair utilizing Arthrex suture anchors.  ANESTHESIA:  General.  ASSISTANT:  Andrez Grime, PA.  HISTORY:  This is a 67 year old female with persistent shoulder pain, impingement pain, pain along the bicipital region anteriorly over the shoulder.  She also had cervical spondylosis with intermittent C6 radiculopathy.  She was indicated for repair of  the rotator cuff 11 x 11 high-grade tear through the supraspinatus.  Risks and benefits discussed including bleeding, infection, damage to neurovascular structures, no change in symptoms, worsening symptoms, DVT, PE, anesthetic complications, etc.  TECHNIQUE:  With the patient in supine beach chair position, after induction of adequate general anesthesia, 2 grams Kefzol right shoulder and upper extremity was prepped and draped in the usual sterile fashion.  Range of motion of the shoulder was  normal.  After prepping and draping, a surgical marker was utilized to delineate the acromion, AC joint and coracoid.  A standard posterolateral portal was incised through the skin only with a #11 blade.  We advanced the arthroscopic cannula in the  glenohumeral joint in line with the coracoid penetrating it atraumatically.  She did  have some tightness in the capsule posteriorly.  Irrigant was utilized to insufflate the joint 35 mmHg.  Inspection revealed hypertrophic synovitis of the glenohumeral  joint.  There was some chondromalacia of the glenohumeral joint and the a small loose cartilaginous loose body.  There was a degenerative tearing of the anterior superior labrum and tendinosis of the biceps tendon.  In addition, there was a tearing of  the supraspinatus rotator cuff just lateral to the bicipital groove.  I fashioned an anterior portal through the skin only with a #11 blade and advanced a 18-gauge needle into the glenohumeral joint, just beneath the biceps tendon. Under direct  visualization, I made an incision with 11 blade and advanced the cannula in line with the needle penetrating the capsule atraumatically just beneath the biceps tendon.  I introduced a shaver and debrided the anterior and superior labrum.  I removed the  loose body.  Debrided the supraspinatus portion of the rotator cuff.  I then used an Arthrowand to cauterize the synovitis and excised with a shaver.  The subscap was unremarkable.  The inferior labrum was unremarkable.  No grade 4 lesions were noted on  the humeral head or the glenoid.  I then shuttled a PDS suture through the tear of the supraspinatus tendon.  After localizing with an 18-gauge needle.  I then decided to convert this to a mini open procedure.  I then redirected the camera in the subacromial space and triangulated with  an anterolateral portal with incision through skin only with an 11 blade.  Hypertrophic synovitis was noted throughout subdeltoid and sub-bursal introduced a shaver and performed a full bursectomy.  There was extreme attenuation and a tear noted in the  supraspinatus lateral to the bicipital groove.  I then used an Arthrowand to release the CA ligament.  I shaved the anterolateral aspect of the acromion.  I then decided to convert to a mini open procedure.  I removed all  instrumentation.  I then made a  2 cm incision over the anterolateral aspect of the acromion.  Subcutaneous tissue was dissected. Electrocautery was utilized to achieve hemostasis.  The raphae between the anterior and lateral heads was divided in line with skin incision.  A  self-retaining retractor was placed.  Digitally lysed subacromial adhesions.  There was a small area where the PDS suture went through the tendon posteriorly and sized that there was I think a normal tendon.  I put in a 0 Vicryl stitch on that.  I  examined the tendon just lateral to the bicipital groove where the PDS exited and was through this attenuated area of the high-grade tear.  I incised that with a 15 blade and excised with a 1 x 1 cm of it to good bleeding tissue. With elevator  decorticated the greater tuberosity.  I mobilized the tendon on its bursal and articular surface.  I placed a self tapping Arthrex suture anchor just lateral to the articular surface into the bone with excellent resistance to pullout.  I then threaded  the sutures through the tendon, both anteriorly, medially and posteriorly with a Scorpion suture passer across these and then advanced into a SwiveLock just on the lateral aspect of the greater tuberosity after fashioning a hole with an awl and inserting  them into the SwiveLock with the arm in the neutral position.  This closed down the tear in the tendon.  I then used a rescue stitch to complete the side to side repair laterally.  Redundant suture removed.  I digitalized subacromial adhesions.   Irrigated the tissue in the subacromial space throughout.  The remainder of the tendon was unremarkable.  Next, after copious irrigation, closed the raphae with #1 Vicryl in interrupted figure-of-eight sutures, subcutaneous with 2-0 and skin with  subcuticular Prolene.  Sterile dressing applied, placed in a sling, extubated, and transported to the recovery room in satisfactory condition.  The patient tolerated  the procedure well.  No complications.  ASSISTANT:  Andrez Grime, PA.  Minimal blood loss.  PA was used throughout the case for patient positioning, retraction of the upper extremity, monitoring inflow and outflow and closure.  Patient's arm was placed in the 70/30 position initially to gain access to the glenohumeral joint and 0/30 degrees for the subacromial space.   PUS D: 06/14/2022 2:18:37 pm T: 06/14/2022 3:41:00 pm  JOB: [id number]/ [id number]

## 2022-06-14 NOTE — Discharge Instructions (Signed)

## 2022-06-15 ENCOUNTER — Encounter (HOSPITAL_COMMUNITY): Payer: Self-pay | Admitting: Specialist

## 2022-06-15 ENCOUNTER — Other Ambulatory Visit (HOSPITAL_BASED_OUTPATIENT_CLINIC_OR_DEPARTMENT_OTHER): Payer: Self-pay

## 2022-06-15 MED ORDER — CEPHALEXIN 500 MG PO CAPS
500.0000 mg | ORAL_CAPSULE | Freq: Four times a day (QID) | ORAL | 0 refills | Status: DC | PRN
  Filled 2022-06-15: qty 4, 1d supply, fill #0

## 2022-06-15 NOTE — Anesthesia Postprocedure Evaluation (Signed)
Anesthesia Post Note  Patient: Angie Fisher  Procedure(s) Performed: RIGHT SHOULDER ARTHROSCOPY WITH MINI OPEN ROTATOR CUFF REPAIR AND SUBACROMIAL DECOMPRESSION (Right)     Patient location during evaluation: PACU Anesthesia Type: General Level of consciousness: awake Pain management: pain level controlled Vital Signs Assessment: post-procedure vital signs reviewed and stable Respiratory status: spontaneous breathing Cardiovascular status: stable Postop Assessment: no apparent nausea or vomiting Anesthetic complications: no  No notable events documented.  Last Vitals:  Vitals:   06/14/22 1500 06/14/22 1508  BP: 126/84 123/85  Pulse: 67 72  Resp: 13   Temp:  (!) 36.4 C  SpO2: 95% 97%    Last Pain:  Vitals:   06/14/22 1515  TempSrc:   PainSc: 5                  John F Keaundra Stehle Jr

## 2022-06-23 ENCOUNTER — Other Ambulatory Visit (HOSPITAL_BASED_OUTPATIENT_CLINIC_OR_DEPARTMENT_OTHER): Payer: Self-pay

## 2022-06-26 ENCOUNTER — Other Ambulatory Visit (HOSPITAL_BASED_OUTPATIENT_CLINIC_OR_DEPARTMENT_OTHER): Payer: Self-pay

## 2022-06-26 MED ORDER — PREDNISONE 5 MG PO TABS
ORAL_TABLET | ORAL | 0 refills | Status: DC
  Filled 2022-06-26: qty 21, 6d supply, fill #0

## 2022-06-29 ENCOUNTER — Other Ambulatory Visit (HOSPITAL_BASED_OUTPATIENT_CLINIC_OR_DEPARTMENT_OTHER): Payer: Self-pay

## 2022-06-29 DIAGNOSIS — Z4789 Encounter for other orthopedic aftercare: Secondary | ICD-10-CM | POA: Diagnosis not present

## 2022-06-30 ENCOUNTER — Other Ambulatory Visit (HOSPITAL_BASED_OUTPATIENT_CLINIC_OR_DEPARTMENT_OTHER): Payer: Self-pay

## 2022-06-30 MED ORDER — BACLOFEN 10 MG PO TABS
10.0000 mg | ORAL_TABLET | Freq: Three times a day (TID) | ORAL | 1 refills | Status: DC | PRN
  Filled 2022-06-30: qty 42, 14d supply, fill #0

## 2022-06-30 MED ORDER — MELOXICAM 15 MG PO TABS
15.0000 mg | ORAL_TABLET | Freq: Every day | ORAL | 1 refills | Status: DC | PRN
  Filled 2022-06-30 – 2022-07-24 (×3): qty 30, 30d supply, fill #0

## 2022-06-30 MED ORDER — OXYCODONE-ACETAMINOPHEN 5-325 MG PO TABS
1.0000 | ORAL_TABLET | Freq: Three times a day (TID) | ORAL | 0 refills | Status: DC | PRN
  Filled 2022-06-30 – 2022-07-11 (×2): qty 21, 7d supply, fill #0

## 2022-07-07 ENCOUNTER — Other Ambulatory Visit (HOSPITAL_BASED_OUTPATIENT_CLINIC_OR_DEPARTMENT_OTHER): Payer: Self-pay

## 2022-07-07 MED ORDER — OXYCODONE-ACETAMINOPHEN 5-325 MG PO TABS
1.0000 | ORAL_TABLET | Freq: Three times a day (TID) | ORAL | 0 refills | Status: DC | PRN
  Filled 2022-07-07: qty 21, 7d supply, fill #0

## 2022-07-07 MED ORDER — BACLOFEN 10 MG PO TABS
10.0000 mg | ORAL_TABLET | Freq: Three times a day (TID) | ORAL | 1 refills | Status: DC
  Filled 2022-07-07 – 2022-07-11 (×2): qty 42, 14d supply, fill #0
  Filled 2022-07-24: qty 42, 14d supply, fill #1

## 2022-07-11 ENCOUNTER — Other Ambulatory Visit (HOSPITAL_BASED_OUTPATIENT_CLINIC_OR_DEPARTMENT_OTHER): Payer: Self-pay

## 2022-07-11 DIAGNOSIS — M25511 Pain in right shoulder: Secondary | ICD-10-CM | POA: Diagnosis not present

## 2022-07-11 DIAGNOSIS — M7541 Impingement syndrome of right shoulder: Secondary | ICD-10-CM | POA: Diagnosis not present

## 2022-07-11 DIAGNOSIS — M47812 Spondylosis without myelopathy or radiculopathy, cervical region: Secondary | ICD-10-CM | POA: Diagnosis not present

## 2022-07-12 ENCOUNTER — Other Ambulatory Visit: Payer: Self-pay

## 2022-07-13 DIAGNOSIS — M25511 Pain in right shoulder: Secondary | ICD-10-CM | POA: Diagnosis not present

## 2022-07-18 DIAGNOSIS — M25511 Pain in right shoulder: Secondary | ICD-10-CM | POA: Diagnosis not present

## 2022-07-20 DIAGNOSIS — M25511 Pain in right shoulder: Secondary | ICD-10-CM | POA: Diagnosis not present

## 2022-07-24 ENCOUNTER — Other Ambulatory Visit (HOSPITAL_BASED_OUTPATIENT_CLINIC_OR_DEPARTMENT_OTHER): Payer: Self-pay

## 2022-07-25 DIAGNOSIS — M25511 Pain in right shoulder: Secondary | ICD-10-CM | POA: Diagnosis not present

## 2022-07-27 DIAGNOSIS — M25511 Pain in right shoulder: Secondary | ICD-10-CM | POA: Diagnosis not present

## 2022-08-01 DIAGNOSIS — M25511 Pain in right shoulder: Secondary | ICD-10-CM | POA: Diagnosis not present

## 2022-08-03 DIAGNOSIS — M25511 Pain in right shoulder: Secondary | ICD-10-CM | POA: Diagnosis not present

## 2022-08-07 DIAGNOSIS — H903 Sensorineural hearing loss, bilateral: Secondary | ICD-10-CM | POA: Diagnosis not present

## 2022-08-07 DIAGNOSIS — H9311 Tinnitus, right ear: Secondary | ICD-10-CM | POA: Diagnosis not present

## 2022-08-08 DIAGNOSIS — M25511 Pain in right shoulder: Secondary | ICD-10-CM | POA: Diagnosis not present

## 2022-08-09 ENCOUNTER — Telehealth: Payer: Self-pay

## 2022-08-09 ENCOUNTER — Other Ambulatory Visit: Payer: Self-pay

## 2022-08-09 ENCOUNTER — Ambulatory Visit
Admission: EM | Admit: 2022-08-09 | Discharge: 2022-08-09 | Disposition: A | Discharge status: Home / Self Care | Payer: Commercial Managed Care - PPO | Attending: Family Medicine | Admitting: Family Medicine

## 2022-08-09 DIAGNOSIS — U071 COVID-19: Secondary | ICD-10-CM | POA: Diagnosis not present

## 2022-08-09 LAB — POC SARS CORONAVIRUS 2 AG -  ED: SARS Coronavirus 2 Ag: POSITIVE — AB

## 2022-08-09 MED ORDER — PAXLOVID (300/100) 20 X 150 MG & 10 X 100MG PO TBPK
3.0000 | ORAL_TABLET | Freq: Two times a day (BID) | ORAL | 0 refills | Status: DC
  Filled 2022-08-09: qty 30, 5d supply, fill #0

## 2022-08-09 MED ORDER — PAXLOVID (300/100) 20 X 150 MG & 10 X 100MG PO TBPK: 3.0000 | ORAL_TABLET | Freq: Two times a day (BID) | ORAL | 0 refills | Status: DC

## 2022-08-09 MED ORDER — PAXLOVID (300/100) 20 X 150 MG & 10 X 100MG PO TBPK: 3.0000 | ORAL_TABLET | Freq: Two times a day (BID) | ORAL | 0 refills | Status: AC

## 2022-08-09 NOTE — Discharge Instructions (Addendum)
Hold simvastatin while on the PAXLOVID Drink lots of water Take tylenol or ibuprofen for pain and fever Take PAXLOVID 2 x a day Call for problems

## 2022-08-09 NOTE — ED Provider Notes (Signed)
[facility]    CSN: [id number] Arrival date & time: 08/09/22  1745      History   Chief Complaint No chief complaint on file.   HPI Angie Fisher is a 67 y.o. female.   HPI  Patient had rotator cuff surgery on 06/14/2022.  She is still recovering.  She has a 4-week follow-up scheduled for this week.  She has regular physical therapy.  She is concerned she will be at these appointments because of her illness.  She currently has headache, body ache, fatigue, sweats and chills, sore throat.  Concern for COVID.  No known exposure.  Has not had COVID.  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      Home Medications    Prior to Admission medications   Medication Sig Start Date End Date Taking? Authorizing Provider  baclofen (LIORESAL) 10 MG tablet Take 1 tablet (10 mg total) by mouth 3 (three) times daily as needed. 07/07/22     Cholecalciferol (VITAMIN D3) 50 MCG (2000 UT) TABS Take 2,000 mcg by mouth daily. 01/02/13   [provider]  nirmatrelvir & ritonavir (PAXLOVID, 300/100,) 20 x 150 MG & 10 x 100MG  TBPK Take 3 tablets by  mouth 2 (two) times daily for 5 days. 08/09/22 08/14/22  Eustace Moore, MD  oxyCODONE-acetaminophen (PERCOCET/ROXICET) 5-325 MG tablet Take 1 tablet by mouth 3 (three) times daily as needed for pain. 07/07/22     simvastatin (ZOCOR) 40 MG tablet Take 1 tablet (40 mg total) by mouth every evening. 05/18/22     timolol (TIMOPTIC) 0.5 % ophthalmic solution INSTILL 1 DROP INTO BOTH EYES TWICE A DAY 02/09/21       Family History Family History  Problem Relation Age of Onset   Hypertension Mother    Diabetes Mother    Dementia Mother    Glaucoma Mother    Bone cancer Father     Social History Social History   Tobacco Use   Smoking status: Former    Current packs/day: 0.25    Average packs/day: 0.3 packs/day for 13.0 years (3.3 ttl pk-yrs)    Types: Cigarettes   Smokeless tobacco: Never  Vaping Use   Vaping status: Never Used  Substance Use Topics   Alcohol use: Yes    Comment: social   Drug use: Never     Allergies   Lidocaine, Sulfonamide derivatives, and Zoster vac recomb adjuvanted   Review of Systems Review of Systems  See HPI Physical Exam Triage Vital Signs  ED Triage Vitals  Encounter Vitals Group     BP 08/09/22 1753 126/86     Systolic BP Percentile --      Diastolic BP Percentile --      Pulse Rate 08/09/22 1753 100     Resp 08/09/22 1753 16     Temp 08/09/22 1753 98.4 F (36.9 C)     Temp Source 08/09/22 1753 Oral     SpO2 08/09/22 1753 98 %     Weight --      Height --      Head Circumference --      Peak Flow --      Pain Score 08/09/22 1754 8     Pain Loc --      Pain Education --      Exclude from Growth Chart --    No data found.  Updated Vital Signs BP 126/86 (BP Location: Left Arm)   Pulse 100   Temp 98.4 F (36.9 C) (Oral)   Resp 16   SpO2 98%      Physical Exam Constitutional:      General: She is not in acute distress.    Appearance: She is well-developed. She is ill-appearing.     Comments: Holds right arm close to body   HENT:     Head: Normocephalic and atraumatic.  Eyes:     Conjunctiva/sclera: Conjunctivae normal.     Pupils: Pupils are equal, round, and reactive to light.  Cardiovascular:     Rate and Rhythm: Normal rate.     Heart sounds: Normal heart sounds.  Pulmonary:     Effort: Pulmonary effort is normal. No respiratory distress.     Breath sounds: Normal breath sounds.  Abdominal:     General: There is no distension.     Palpations: Abdomen is soft.  Musculoskeletal:        General: Normal range of motion.     Cervical back: Normal range of motion.  Skin:    General: Skin is warm and dry.  Neurological:     Mental Status: She is alert.      UC Treatments / Results  Labs (all labs ordered are listed, but only abnormal results are displayed) Labs Reviewed  POC SARS CORONAVIRUS 2 AG -  ED - Abnormal; Notable for the following components:      Result Value   SARS Coronavirus 2 Ag Positive (*)    All other components within normal limits    EKG   Radiology No results found.  Procedures Procedures (including critical care time)  Medications Ordered in UC Medications - No data to display  Initial Impression / Assessment and Plan / UC Course  I have reviewed the triage vital signs and the nursing notes.  Pertinent labs & imaging results that were available during my care of the patient were reviewed by me and considered in my medical decision making (see chart for details).    Reviewed medication.  Patient desires COVID.  Her last GFR was over 100. Final Clinical Impressions(s) / UC Diagnoses   Final diagnoses:  COVID-19     Discharge Instructions      Hold simvastatin while on the PAXLOVID Drink lots of water Take tylenol or ibuprofen for pain and fever Take PAXLOVID 2 x a day Call for problems   ED Prescriptions     Medication Sig Dispense Auth. Provider   nirmatrelvir & ritonavir (PAXLOVID, 300/100,) 20 x 150 MG & 10 x 100MG  TBPK  Take 3 tablets by  mouth 2 (two) times daily for 5 days. 30 tablet Eustace Moore, MD      PDMP not reviewed this encounter.   Eustace Moore, MD 08/09/22 [phone]

## 2022-08-09 NOTE — Telephone Encounter (Signed)
Pharmacy changed to [facility] on [address] [place], [place]

## 2022-08-09 NOTE — ED Triage Notes (Addendum)
Sore throat, fever, chills, cough since this morning

## 2022-08-10 ENCOUNTER — Other Ambulatory Visit (HOSPITAL_BASED_OUTPATIENT_CLINIC_OR_DEPARTMENT_OTHER): Payer: Self-pay

## 2022-08-15 ENCOUNTER — Other Ambulatory Visit (HOSPITAL_BASED_OUTPATIENT_CLINIC_OR_DEPARTMENT_OTHER): Payer: Self-pay

## 2022-08-15 MED ORDER — OXYCODONE-ACETAMINOPHEN 5-325 MG PO TABS
1.0000 | ORAL_TABLET | Freq: Three times a day (TID) | ORAL | 0 refills | Status: DC | PRN
  Filled 2022-08-15: qty 21, 7d supply, fill #0

## 2022-08-15 MED ORDER — BACLOFEN 10 MG PO TABS
10.0000 mg | ORAL_TABLET | Freq: Three times a day (TID) | ORAL | 1 refills | Status: DC | PRN
  Filled 2022-08-15: qty 42, 14d supply, fill #0
  Filled 2022-08-31: qty 42, 14d supply, fill #1

## 2022-08-15 MED ORDER — TIMOLOL MALEATE 0.5 % OP SOLN
1.0000 [drp] | Freq: Two times a day (BID) | OPHTHALMIC | 4 refills | Status: DC
  Filled 2022-08-15: qty 10, 90d supply, fill #0
  Filled 2022-10-25 – 2022-10-26 (×2): qty 10, 90d supply, fill #1
  Filled 2023-01-05: qty 10, 50d supply, fill #2
  Filled 2023-03-12: qty 10, 50d supply, fill #3
  Filled 2023-03-12: qty 10, 90d supply, fill #3
  Filled 2023-05-03: qty 10, 50d supply, fill #4

## 2022-08-21 ENCOUNTER — Other Ambulatory Visit (HOSPITAL_BASED_OUTPATIENT_CLINIC_OR_DEPARTMENT_OTHER): Payer: Self-pay

## 2022-08-22 DIAGNOSIS — M25511 Pain in right shoulder: Secondary | ICD-10-CM | POA: Diagnosis not present

## 2022-08-24 DIAGNOSIS — M25511 Pain in right shoulder: Secondary | ICD-10-CM | POA: Diagnosis not present

## 2022-08-29 DIAGNOSIS — M79621 Pain in right upper arm: Secondary | ICD-10-CM | POA: Diagnosis not present

## 2022-08-29 DIAGNOSIS — M25511 Pain in right shoulder: Secondary | ICD-10-CM | POA: Diagnosis not present

## 2022-08-31 DIAGNOSIS — M25511 Pain in right shoulder: Secondary | ICD-10-CM | POA: Diagnosis not present

## 2022-09-05 ENCOUNTER — Other Ambulatory Visit: Payer: Self-pay | Admitting: Specialist

## 2022-09-05 ENCOUNTER — Other Ambulatory Visit (HOSPITAL_BASED_OUTPATIENT_CLINIC_OR_DEPARTMENT_OTHER): Payer: Self-pay

## 2022-09-05 ENCOUNTER — Other Ambulatory Visit: Payer: Self-pay

## 2022-09-05 DIAGNOSIS — R160 Hepatomegaly, not elsewhere classified: Secondary | ICD-10-CM

## 2022-09-05 DIAGNOSIS — M25511 Pain in right shoulder: Secondary | ICD-10-CM | POA: Diagnosis not present

## 2022-09-05 MED ORDER — PREDNISONE 5 MG PO TABS
ORAL_TABLET | ORAL | 1 refills | Status: DC
  Filled 2022-09-05: qty 21, 6d supply, fill #0
  Filled 2022-11-23: qty 21, 6d supply, fill #1

## 2022-09-05 MED ORDER — CELECOXIB 200 MG PO CAPS
200.0000 mg | ORAL_CAPSULE | Freq: Every day | ORAL | 1 refills | Status: DC | PRN
  Filled 2022-09-05 (×2): qty 30, 30d supply, fill #0
  Filled 2022-10-09: qty 30, 30d supply, fill #1

## 2022-09-05 MED ORDER — BACLOFEN 10 MG PO TABS
10.0000 mg | ORAL_TABLET | Freq: Three times a day (TID) | ORAL | 1 refills | Status: DC | PRN
  Filled 2022-09-05 – 2022-09-13 (×2): qty 42, 14d supply, fill #0
  Filled 2022-10-09: qty 42, 14d supply, fill #1

## 2022-09-05 MED ORDER — OXYCODONE-ACETAMINOPHEN 5-325 MG PO TABS
1.0000 | ORAL_TABLET | Freq: Three times a day (TID) | ORAL | 0 refills | Status: DC | PRN
  Filled 2022-09-05: qty 21, 7d supply, fill #0

## 2022-09-07 DIAGNOSIS — M25511 Pain in right shoulder: Secondary | ICD-10-CM | POA: Diagnosis not present

## 2022-09-08 ENCOUNTER — Other Ambulatory Visit: Payer: Commercial Managed Care - PPO

## 2022-09-09 DIAGNOSIS — M5412 Radiculopathy, cervical region: Secondary | ICD-10-CM | POA: Diagnosis not present

## 2022-09-11 ENCOUNTER — Ambulatory Visit
Admission: RE | Admit: 2022-09-11 | Discharge: 2022-09-11 | Disposition: A | Discharge status: Home / Self Care | Payer: Commercial Managed Care - PPO | Source: Ambulatory Visit | Attending: Specialist | Admitting: Specialist

## 2022-09-11 DIAGNOSIS — K7689 Other specified diseases of liver: Secondary | ICD-10-CM | POA: Diagnosis not present

## 2022-09-11 DIAGNOSIS — R160 Hepatomegaly, not elsewhere classified: Secondary | ICD-10-CM

## 2022-09-11 DIAGNOSIS — R162 Hepatomegaly with splenomegaly, not elsewhere classified: Secondary | ICD-10-CM | POA: Diagnosis not present

## 2022-09-12 ENCOUNTER — Other Ambulatory Visit (HOSPITAL_BASED_OUTPATIENT_CLINIC_OR_DEPARTMENT_OTHER): Payer: Self-pay

## 2022-09-12 DIAGNOSIS — M25511 Pain in right shoulder: Secondary | ICD-10-CM | POA: Diagnosis not present

## 2022-09-12 MED ORDER — GABAPENTIN 300 MG PO CAPS
300.0000 mg | ORAL_CAPSULE | Freq: Every evening | ORAL | 2 refills | Status: DC
  Filled 2022-09-12: qty 30, 30d supply, fill #0
  Filled 2022-10-09: qty 30, 30d supply, fill #1
  Filled 2022-11-15: qty 30, 30d supply, fill #2

## 2022-09-13 ENCOUNTER — Other Ambulatory Visit: Payer: Self-pay

## 2022-09-13 ENCOUNTER — Other Ambulatory Visit (HOSPITAL_BASED_OUTPATIENT_CLINIC_OR_DEPARTMENT_OTHER): Payer: Self-pay

## 2022-09-14 DIAGNOSIS — M5412 Radiculopathy, cervical region: Secondary | ICD-10-CM | POA: Diagnosis not present

## 2022-09-19 DIAGNOSIS — M25511 Pain in right shoulder: Secondary | ICD-10-CM | POA: Diagnosis not present

## 2022-09-20 DIAGNOSIS — Z6825 Body mass index (BMI) 25.0-25.9, adult: Secondary | ICD-10-CM | POA: Diagnosis not present

## 2022-09-20 DIAGNOSIS — R160 Hepatomegaly, not elsewhere classified: Secondary | ICD-10-CM | POA: Diagnosis not present

## 2022-09-21 DIAGNOSIS — M25511 Pain in right shoulder: Secondary | ICD-10-CM | POA: Diagnosis not present

## 2022-09-25 DIAGNOSIS — M542 Cervicalgia: Secondary | ICD-10-CM | POA: Diagnosis not present

## 2022-09-26 DIAGNOSIS — M25511 Pain in right shoulder: Secondary | ICD-10-CM | POA: Diagnosis not present

## 2022-09-26 DIAGNOSIS — M7501 Adhesive capsulitis of right shoulder: Secondary | ICD-10-CM | POA: Diagnosis not present

## 2022-09-28 DIAGNOSIS — M25511 Pain in right shoulder: Secondary | ICD-10-CM | POA: Diagnosis not present

## 2022-10-03 DIAGNOSIS — M25511 Pain in right shoulder: Secondary | ICD-10-CM | POA: Diagnosis not present

## 2022-10-05 DIAGNOSIS — M25511 Pain in right shoulder: Secondary | ICD-10-CM | POA: Diagnosis not present

## 2022-10-10 DIAGNOSIS — M25511 Pain in right shoulder: Secondary | ICD-10-CM | POA: Diagnosis not present

## 2022-10-12 DIAGNOSIS — M25511 Pain in right shoulder: Secondary | ICD-10-CM | POA: Diagnosis not present

## 2022-10-17 DIAGNOSIS — M25511 Pain in right shoulder: Secondary | ICD-10-CM | POA: Diagnosis not present

## 2022-10-18 DIAGNOSIS — M5412 Radiculopathy, cervical region: Secondary | ICD-10-CM | POA: Diagnosis not present

## 2022-10-19 DIAGNOSIS — M25511 Pain in right shoulder: Secondary | ICD-10-CM | POA: Diagnosis not present

## 2022-10-23 DIAGNOSIS — M5412 Radiculopathy, cervical region: Secondary | ICD-10-CM | POA: Diagnosis not present

## 2022-10-25 ENCOUNTER — Other Ambulatory Visit (HOSPITAL_BASED_OUTPATIENT_CLINIC_OR_DEPARTMENT_OTHER): Payer: Self-pay

## 2022-10-25 DIAGNOSIS — M5412 Radiculopathy, cervical region: Secondary | ICD-10-CM | POA: Diagnosis not present

## 2022-10-26 ENCOUNTER — Other Ambulatory Visit: Payer: Self-pay

## 2022-10-26 ENCOUNTER — Other Ambulatory Visit (HOSPITAL_BASED_OUTPATIENT_CLINIC_OR_DEPARTMENT_OTHER): Payer: Self-pay

## 2022-10-30 DIAGNOSIS — M5412 Radiculopathy, cervical region: Secondary | ICD-10-CM | POA: Diagnosis not present

## 2022-11-01 DIAGNOSIS — Z01419 Encounter for gynecological examination (general) (routine) without abnormal findings: Secondary | ICD-10-CM | POA: Diagnosis not present

## 2022-11-01 DIAGNOSIS — Z1231 Encounter for screening mammogram for malignant neoplasm of breast: Secondary | ICD-10-CM | POA: Diagnosis not present

## 2022-11-01 DIAGNOSIS — Z6825 Body mass index (BMI) 25.0-25.9, adult: Secondary | ICD-10-CM | POA: Diagnosis not present

## 2022-11-06 DIAGNOSIS — M5412 Radiculopathy, cervical region: Secondary | ICD-10-CM | POA: Diagnosis not present

## 2022-11-07 DIAGNOSIS — M542 Cervicalgia: Secondary | ICD-10-CM | POA: Diagnosis not present

## 2022-11-09 DIAGNOSIS — M7501 Adhesive capsulitis of right shoulder: Secondary | ICD-10-CM | POA: Diagnosis not present

## 2022-11-09 DIAGNOSIS — R29898 Other symptoms and signs involving the musculoskeletal system: Secondary | ICD-10-CM | POA: Diagnosis not present

## 2022-11-10 DIAGNOSIS — H2513 Age-related nuclear cataract, bilateral: Secondary | ICD-10-CM | POA: Diagnosis not present

## 2022-11-10 DIAGNOSIS — H401131 Primary open-angle glaucoma, bilateral, mild stage: Secondary | ICD-10-CM | POA: Diagnosis not present

## 2022-11-13 DIAGNOSIS — M5412 Radiculopathy, cervical region: Secondary | ICD-10-CM | POA: Diagnosis not present

## 2022-11-14 DIAGNOSIS — R160 Hepatomegaly, not elsewhere classified: Secondary | ICD-10-CM | POA: Diagnosis not present

## 2022-11-14 DIAGNOSIS — Z4889 Encounter for other specified surgical aftercare: Secondary | ICD-10-CM | POA: Diagnosis not present

## 2022-11-14 DIAGNOSIS — M25511 Pain in right shoulder: Secondary | ICD-10-CM | POA: Diagnosis not present

## 2022-11-15 DIAGNOSIS — M5412 Radiculopathy, cervical region: Secondary | ICD-10-CM | POA: Diagnosis not present

## 2022-11-16 DIAGNOSIS — M25511 Pain in right shoulder: Secondary | ICD-10-CM | POA: Diagnosis not present

## 2022-11-20 DIAGNOSIS — M5412 Radiculopathy, cervical region: Secondary | ICD-10-CM | POA: Diagnosis not present

## 2022-11-21 DIAGNOSIS — M25511 Pain in right shoulder: Secondary | ICD-10-CM | POA: Diagnosis not present

## 2022-11-22 DIAGNOSIS — M5412 Radiculopathy, cervical region: Secondary | ICD-10-CM | POA: Diagnosis not present

## 2022-11-23 DIAGNOSIS — M25511 Pain in right shoulder: Secondary | ICD-10-CM | POA: Diagnosis not present

## 2022-11-27 DIAGNOSIS — M5412 Radiculopathy, cervical region: Secondary | ICD-10-CM | POA: Diagnosis not present

## 2022-11-27 DIAGNOSIS — M79601 Pain in right arm: Secondary | ICD-10-CM | POA: Diagnosis not present

## 2022-11-27 DIAGNOSIS — M25511 Pain in right shoulder: Secondary | ICD-10-CM | POA: Diagnosis not present

## 2022-11-27 DIAGNOSIS — M542 Cervicalgia: Secondary | ICD-10-CM | POA: Diagnosis not present

## 2022-11-28 DIAGNOSIS — M25511 Pain in right shoulder: Secondary | ICD-10-CM | POA: Diagnosis not present

## 2022-12-07 DIAGNOSIS — M25511 Pain in right shoulder: Secondary | ICD-10-CM | POA: Diagnosis not present

## 2022-12-12 DIAGNOSIS — M25511 Pain in right shoulder: Secondary | ICD-10-CM | POA: Diagnosis not present

## 2022-12-16 DIAGNOSIS — M5412 Radiculopathy, cervical region: Secondary | ICD-10-CM | POA: Diagnosis not present

## 2022-12-20 DIAGNOSIS — M7501 Adhesive capsulitis of right shoulder: Secondary | ICD-10-CM | POA: Diagnosis not present

## 2022-12-21 ENCOUNTER — Other Ambulatory Visit (HOSPITAL_BASED_OUTPATIENT_CLINIC_OR_DEPARTMENT_OTHER): Payer: Self-pay

## 2022-12-21 DIAGNOSIS — M25511 Pain in right shoulder: Secondary | ICD-10-CM | POA: Diagnosis not present

## 2022-12-21 MED ORDER — GABAPENTIN 300 MG PO CAPS
300.0000 mg | ORAL_CAPSULE | Freq: Every day | ORAL | 2 refills | Status: DC | PRN
  Filled 2022-12-21: qty 30, 30d supply, fill #0
  Filled 2023-02-06: qty 30, 30d supply, fill #1
  Filled 2023-03-07: qty 30, 30d supply, fill #2

## 2022-12-21 MED ORDER — CELECOXIB 200 MG PO CAPS
200.0000 mg | ORAL_CAPSULE | Freq: Every day | ORAL | 1 refills | Status: DC | PRN
  Filled 2022-12-21: qty 30, 30d supply, fill #0
  Filled 2023-02-06: qty 30, 30d supply, fill #1

## 2022-12-22 ENCOUNTER — Other Ambulatory Visit (HOSPITAL_BASED_OUTPATIENT_CLINIC_OR_DEPARTMENT_OTHER): Payer: Self-pay

## 2022-12-26 DIAGNOSIS — M25511 Pain in right shoulder: Secondary | ICD-10-CM | POA: Diagnosis not present

## 2023-01-02 ENCOUNTER — Other Ambulatory Visit (HOSPITAL_BASED_OUTPATIENT_CLINIC_OR_DEPARTMENT_OTHER): Payer: Self-pay

## 2023-01-02 DIAGNOSIS — D485 Neoplasm of uncertain behavior of skin: Secondary | ICD-10-CM | POA: Diagnosis not present

## 2023-01-02 DIAGNOSIS — M25511 Pain in right shoulder: Secondary | ICD-10-CM | POA: Diagnosis not present

## 2023-01-02 DIAGNOSIS — L249 Irritant contact dermatitis, unspecified cause: Secondary | ICD-10-CM | POA: Diagnosis not present

## 2023-01-02 DIAGNOSIS — L821 Other seborrheic keratosis: Secondary | ICD-10-CM | POA: Diagnosis not present

## 2023-01-02 MED ORDER — TRIAMCINOLONE ACETONIDE 0.1 % EX CREA
1.0000 | TOPICAL_CREAM | Freq: Two times a day (BID) | CUTANEOUS | 0 refills | Status: DC
  Filled 2023-01-02: qty 30, 14d supply, fill #0

## 2023-01-05 ENCOUNTER — Other Ambulatory Visit (HOSPITAL_BASED_OUTPATIENT_CLINIC_OR_DEPARTMENT_OTHER): Payer: Self-pay

## 2023-01-29 DIAGNOSIS — Z4889 Encounter for other specified surgical aftercare: Secondary | ICD-10-CM | POA: Diagnosis not present

## 2023-01-29 DIAGNOSIS — M25511 Pain in right shoulder: Secondary | ICD-10-CM | POA: Diagnosis not present

## 2023-01-29 DIAGNOSIS — R160 Hepatomegaly, not elsewhere classified: Secondary | ICD-10-CM | POA: Diagnosis not present

## 2023-02-02 DIAGNOSIS — M5412 Radiculopathy, cervical region: Secondary | ICD-10-CM | POA: Diagnosis not present

## 2023-02-06 DIAGNOSIS — Z0000 Encounter for general adult medical examination without abnormal findings: Secondary | ICD-10-CM | POA: Diagnosis not present

## 2023-02-06 DIAGNOSIS — E7800 Pure hypercholesterolemia, unspecified: Secondary | ICD-10-CM | POA: Diagnosis not present

## 2023-02-06 DIAGNOSIS — E559 Vitamin D deficiency, unspecified: Secondary | ICD-10-CM | POA: Diagnosis not present

## 2023-02-06 DIAGNOSIS — Z23 Encounter for immunization: Secondary | ICD-10-CM | POA: Diagnosis not present

## 2023-02-06 DIAGNOSIS — G8929 Other chronic pain: Secondary | ICD-10-CM | POA: Diagnosis not present

## 2023-02-06 DIAGNOSIS — M25511 Pain in right shoulder: Secondary | ICD-10-CM | POA: Diagnosis not present

## 2023-02-25 DIAGNOSIS — M5412 Radiculopathy, cervical region: Secondary | ICD-10-CM | POA: Diagnosis not present

## 2023-03-01 DIAGNOSIS — D225 Melanocytic nevi of trunk: Secondary | ICD-10-CM | POA: Diagnosis not present

## 2023-03-01 DIAGNOSIS — L821 Other seborrheic keratosis: Secondary | ICD-10-CM | POA: Diagnosis not present

## 2023-03-01 DIAGNOSIS — Z808 Family history of malignant neoplasm of other organs or systems: Secondary | ICD-10-CM | POA: Diagnosis not present

## 2023-03-01 DIAGNOSIS — L719 Rosacea, unspecified: Secondary | ICD-10-CM | POA: Diagnosis not present

## 2023-03-01 DIAGNOSIS — L814 Other melanin hyperpigmentation: Secondary | ICD-10-CM | POA: Diagnosis not present

## 2023-03-01 DIAGNOSIS — D485 Neoplasm of uncertain behavior of skin: Secondary | ICD-10-CM | POA: Diagnosis not present

## 2023-03-01 DIAGNOSIS — D2271 Melanocytic nevi of right lower limb, including hip: Secondary | ICD-10-CM | POA: Diagnosis not present

## 2023-03-01 DIAGNOSIS — L578 Other skin changes due to chronic exposure to nonionizing radiation: Secondary | ICD-10-CM | POA: Diagnosis not present

## 2023-03-02 ENCOUNTER — Other Ambulatory Visit (HOSPITAL_BASED_OUTPATIENT_CLINIC_OR_DEPARTMENT_OTHER): Payer: Self-pay

## 2023-03-05 ENCOUNTER — Other Ambulatory Visit (HOSPITAL_BASED_OUTPATIENT_CLINIC_OR_DEPARTMENT_OTHER): Payer: Self-pay

## 2023-03-06 DIAGNOSIS — M5412 Radiculopathy, cervical region: Secondary | ICD-10-CM | POA: Diagnosis not present

## 2023-03-07 ENCOUNTER — Other Ambulatory Visit (HOSPITAL_BASED_OUTPATIENT_CLINIC_OR_DEPARTMENT_OTHER): Payer: Self-pay

## 2023-03-07 DIAGNOSIS — R160 Hepatomegaly, not elsewhere classified: Secondary | ICD-10-CM | POA: Diagnosis not present

## 2023-03-07 DIAGNOSIS — M25511 Pain in right shoulder: Secondary | ICD-10-CM | POA: Diagnosis not present

## 2023-03-07 DIAGNOSIS — Z4889 Encounter for other specified surgical aftercare: Secondary | ICD-10-CM | POA: Diagnosis not present

## 2023-03-07 MED ORDER — PREDNISONE 5 MG PO TABS
ORAL_TABLET | ORAL | 0 refills | Status: AC
  Filled 2023-03-07: qty 21, 6d supply, fill #0

## 2023-03-12 ENCOUNTER — Other Ambulatory Visit: Payer: Self-pay

## 2023-03-12 ENCOUNTER — Other Ambulatory Visit (HOSPITAL_BASED_OUTPATIENT_CLINIC_OR_DEPARTMENT_OTHER): Payer: Self-pay

## 2023-03-12 ENCOUNTER — Other Ambulatory Visit (HOSPITAL_COMMUNITY): Payer: Self-pay

## 2023-03-16 DIAGNOSIS — M25511 Pain in right shoulder: Secondary | ICD-10-CM | POA: Diagnosis not present

## 2023-03-16 DIAGNOSIS — E7800 Pure hypercholesterolemia, unspecified: Secondary | ICD-10-CM | POA: Diagnosis not present

## 2023-03-16 DIAGNOSIS — M542 Cervicalgia: Secondary | ICD-10-CM | POA: Diagnosis not present

## 2023-03-16 DIAGNOSIS — Z01818 Encounter for other preprocedural examination: Secondary | ICD-10-CM | POA: Diagnosis not present

## 2023-03-20 DIAGNOSIS — R933 Abnormal findings on diagnostic imaging of other parts of digestive tract: Secondary | ICD-10-CM | POA: Diagnosis not present

## 2023-03-20 DIAGNOSIS — E782 Mixed hyperlipidemia: Secondary | ICD-10-CM | POA: Diagnosis not present

## 2023-03-20 DIAGNOSIS — Z1211 Encounter for screening for malignant neoplasm of colon: Secondary | ICD-10-CM | POA: Diagnosis not present

## 2023-03-21 DIAGNOSIS — D485 Neoplasm of uncertain behavior of skin: Secondary | ICD-10-CM | POA: Diagnosis not present

## 2023-03-21 DIAGNOSIS — D1801 Hemangioma of skin and subcutaneous tissue: Secondary | ICD-10-CM | POA: Diagnosis not present

## 2023-04-10 DIAGNOSIS — M5412 Radiculopathy, cervical region: Secondary | ICD-10-CM | POA: Diagnosis not present

## 2023-04-20 ENCOUNTER — Other Ambulatory Visit (HOSPITAL_BASED_OUTPATIENT_CLINIC_OR_DEPARTMENT_OTHER): Payer: Self-pay

## 2023-04-20 DIAGNOSIS — M50121 Cervical disc disorder at C4-C5 level with radiculopathy: Secondary | ICD-10-CM | POA: Diagnosis not present

## 2023-04-20 DIAGNOSIS — M50122 Cervical disc disorder at C5-C6 level with radiculopathy: Secondary | ICD-10-CM | POA: Diagnosis not present

## 2023-04-20 MED ORDER — METHOCARBAMOL 500 MG PO TABS
500.0000 mg | ORAL_TABLET | Freq: Three times a day (TID) | ORAL | 0 refills | Status: AC
  Filled 2023-04-20: qty 15, 5d supply, fill #0

## 2023-04-20 MED ORDER — ONDANSETRON 8 MG PO TBDP
8.0000 mg | ORAL_TABLET | Freq: Three times a day (TID) | ORAL | 0 refills | Status: AC | PRN
  Filled 2023-04-20: qty 30, 10d supply, fill #0

## 2023-04-20 MED ORDER — OXYCODONE-ACETAMINOPHEN 10-325 MG PO TABS
1.0000 | ORAL_TABLET | Freq: Four times a day (QID) | ORAL | 0 refills | Status: AC | PRN
  Filled 2023-04-20: qty 20, 5d supply, fill #0

## 2023-04-26 ENCOUNTER — Other Ambulatory Visit (HOSPITAL_BASED_OUTPATIENT_CLINIC_OR_DEPARTMENT_OTHER): Payer: Self-pay

## 2023-04-27 ENCOUNTER — Other Ambulatory Visit (HOSPITAL_BASED_OUTPATIENT_CLINIC_OR_DEPARTMENT_OTHER): Payer: Self-pay

## 2023-04-27 ENCOUNTER — Other Ambulatory Visit (HOSPITAL_COMMUNITY): Payer: Self-pay

## 2023-04-27 MED ORDER — METHOCARBAMOL 500 MG PO TABS
500.0000 mg | ORAL_TABLET | Freq: Three times a day (TID) | ORAL | 0 refills | Status: DC | PRN
Start: 2023-04-27 — End: 2023-05-14
  Filled 2023-04-27: qty 42, 14d supply, fill #0

## 2023-04-27 MED ORDER — OXYCODONE-ACETAMINOPHEN 10-325 MG PO TABS
1.0000 | ORAL_TABLET | Freq: Three times a day (TID) | ORAL | 0 refills | Status: DC | PRN
  Filled 2023-04-27: qty 42, 14d supply, fill #0

## 2023-05-01 ENCOUNTER — Other Ambulatory Visit (HOSPITAL_BASED_OUTPATIENT_CLINIC_OR_DEPARTMENT_OTHER): Payer: Self-pay

## 2023-05-10 ENCOUNTER — Other Ambulatory Visit (HOSPITAL_BASED_OUTPATIENT_CLINIC_OR_DEPARTMENT_OTHER): Payer: Self-pay

## 2023-05-10 DIAGNOSIS — M25511 Pain in right shoulder: Secondary | ICD-10-CM | POA: Diagnosis not present

## 2023-05-10 DIAGNOSIS — R160 Hepatomegaly, not elsewhere classified: Secondary | ICD-10-CM | POA: Diagnosis not present

## 2023-05-10 DIAGNOSIS — Z4889 Encounter for other specified surgical aftercare: Secondary | ICD-10-CM | POA: Diagnosis not present

## 2023-05-10 MED ORDER — GABAPENTIN 300 MG PO CAPS
300.0000 mg | ORAL_CAPSULE | Freq: Every evening | ORAL | 2 refills | Status: DC
  Filled 2023-05-10: qty 30, 30d supply, fill #0
  Filled 2023-06-15: qty 30, 30d supply, fill #1
  Filled 2023-10-29: qty 30, 30d supply, fill #2

## 2023-05-14 ENCOUNTER — Other Ambulatory Visit (HOSPITAL_BASED_OUTPATIENT_CLINIC_OR_DEPARTMENT_OTHER): Payer: Self-pay

## 2023-05-14 MED ORDER — METHOCARBAMOL 500 MG PO TABS
500.0000 mg | ORAL_TABLET | Freq: Three times a day (TID) | ORAL | 0 refills | Status: DC | PRN
  Filled 2023-05-14: qty 42, 14d supply, fill #0

## 2023-06-01 ENCOUNTER — Other Ambulatory Visit (HOSPITAL_BASED_OUTPATIENT_CLINIC_OR_DEPARTMENT_OTHER): Payer: Self-pay

## 2023-06-01 DIAGNOSIS — M542 Cervicalgia: Secondary | ICD-10-CM | POA: Diagnosis not present

## 2023-06-01 DIAGNOSIS — G8918 Other acute postprocedural pain: Secondary | ICD-10-CM | POA: Diagnosis not present

## 2023-06-01 MED ORDER — METHOCARBAMOL 500 MG PO TABS
500.0000 mg | ORAL_TABLET | Freq: Two times a day (BID) | ORAL | 0 refills | Status: AC | PRN
  Filled 2023-06-01: qty 30, 15d supply, fill #0

## 2023-06-07 IMAGING — CR DG LUMBAR SPINE 2-3V
3 series · 3 of 3 positions shown · non-contrast
Comparison: May 19, 2015

CLINICAL DATA: Pain.  Fall December 26, 2020.

EXAM:
LUMBAR SPINE - 2-3 VIEW

[w lumbar spine ap]
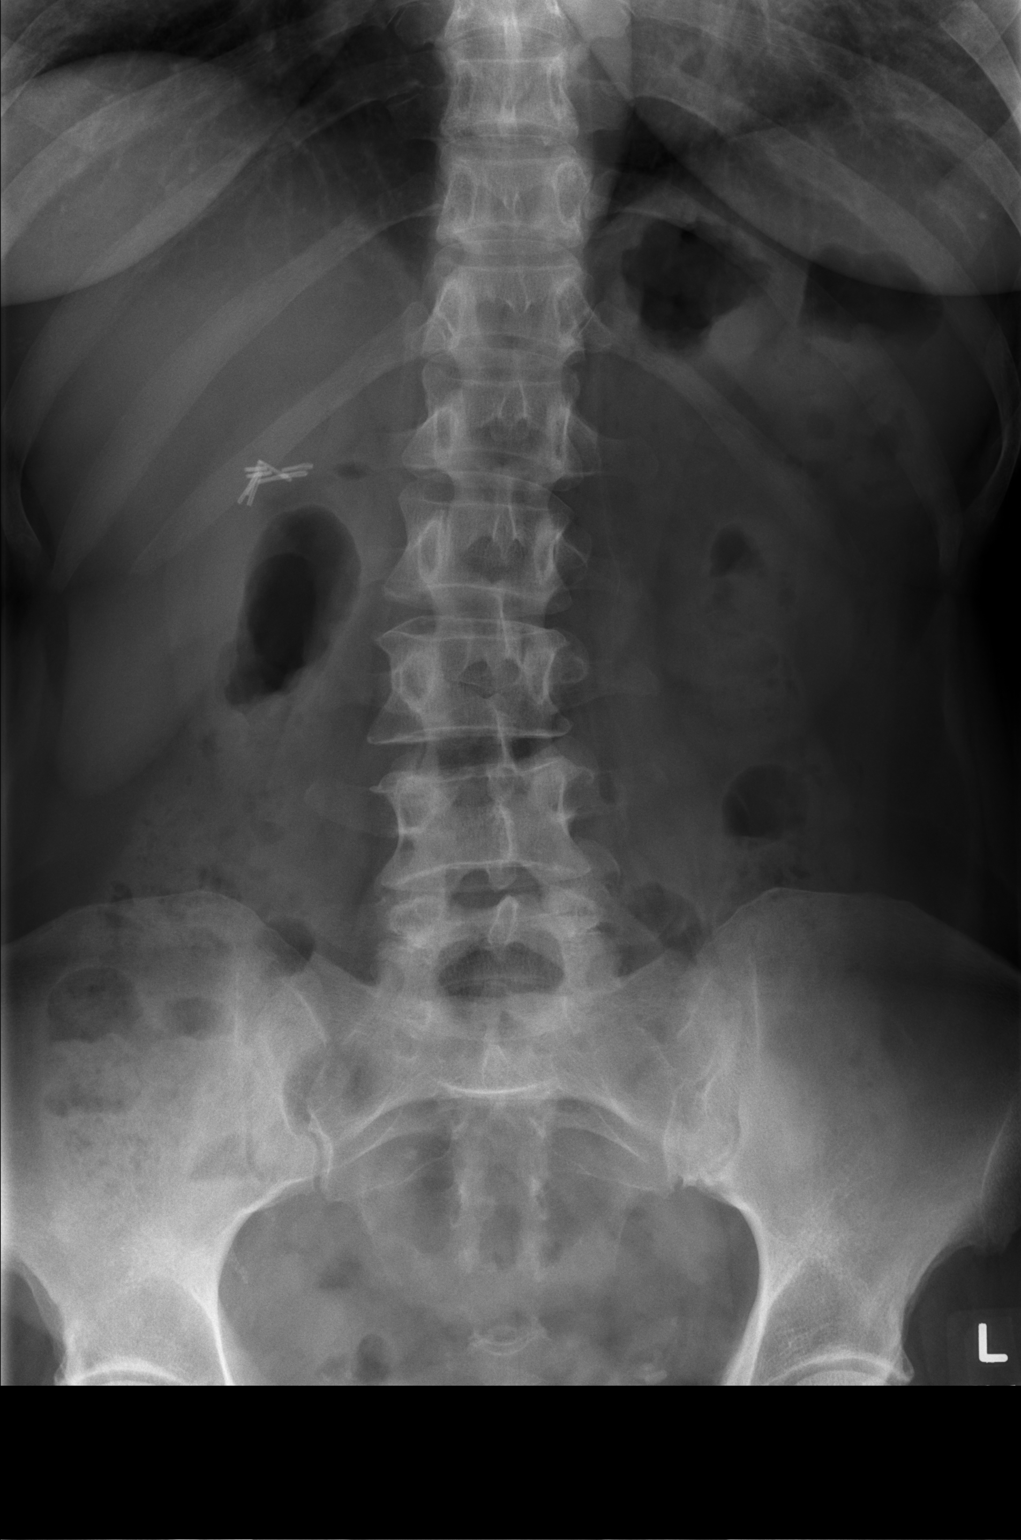

[w lumbar spine lat]
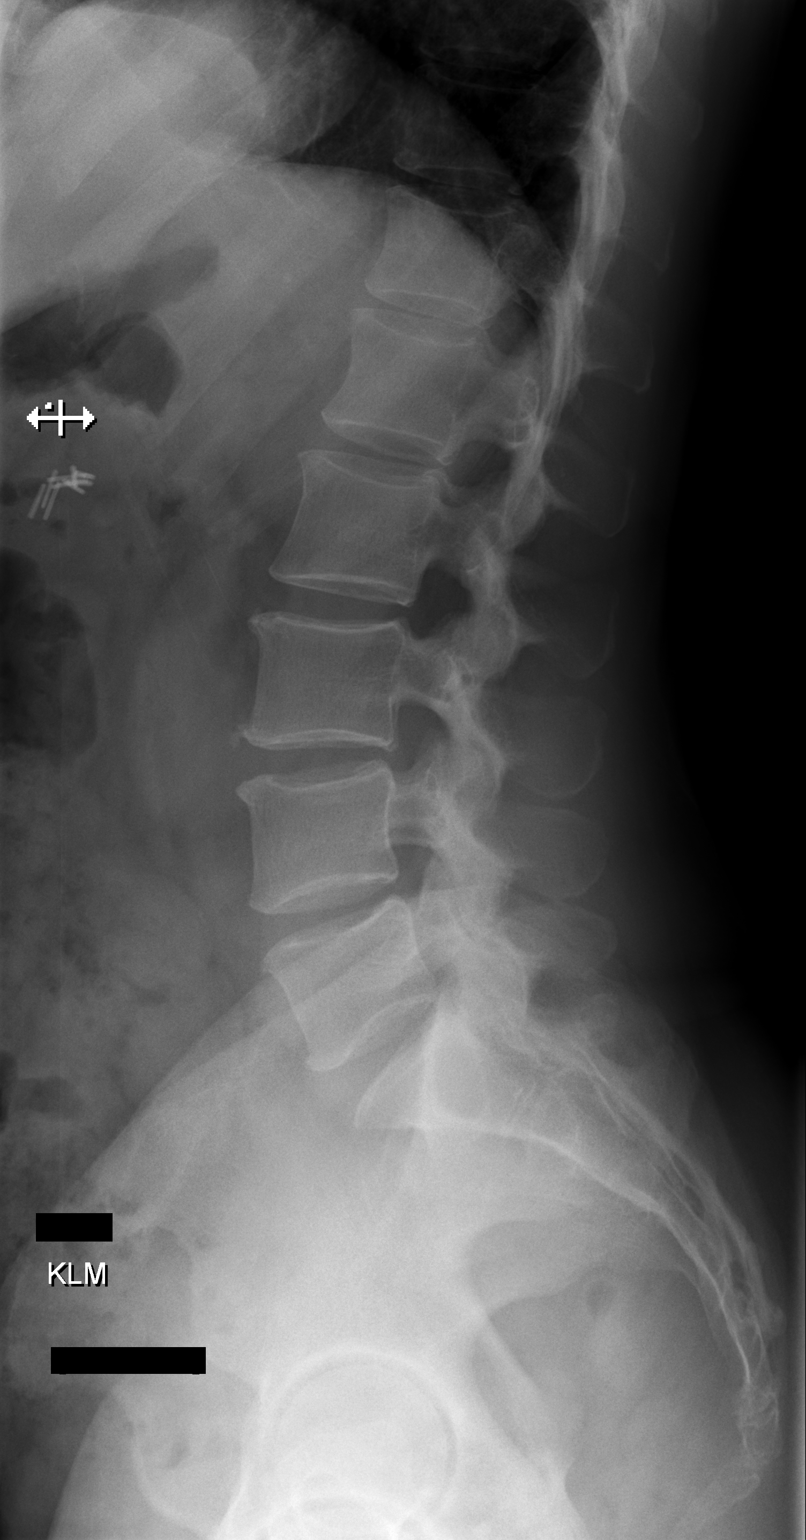

[w lumbar l-5 s-1 spot]
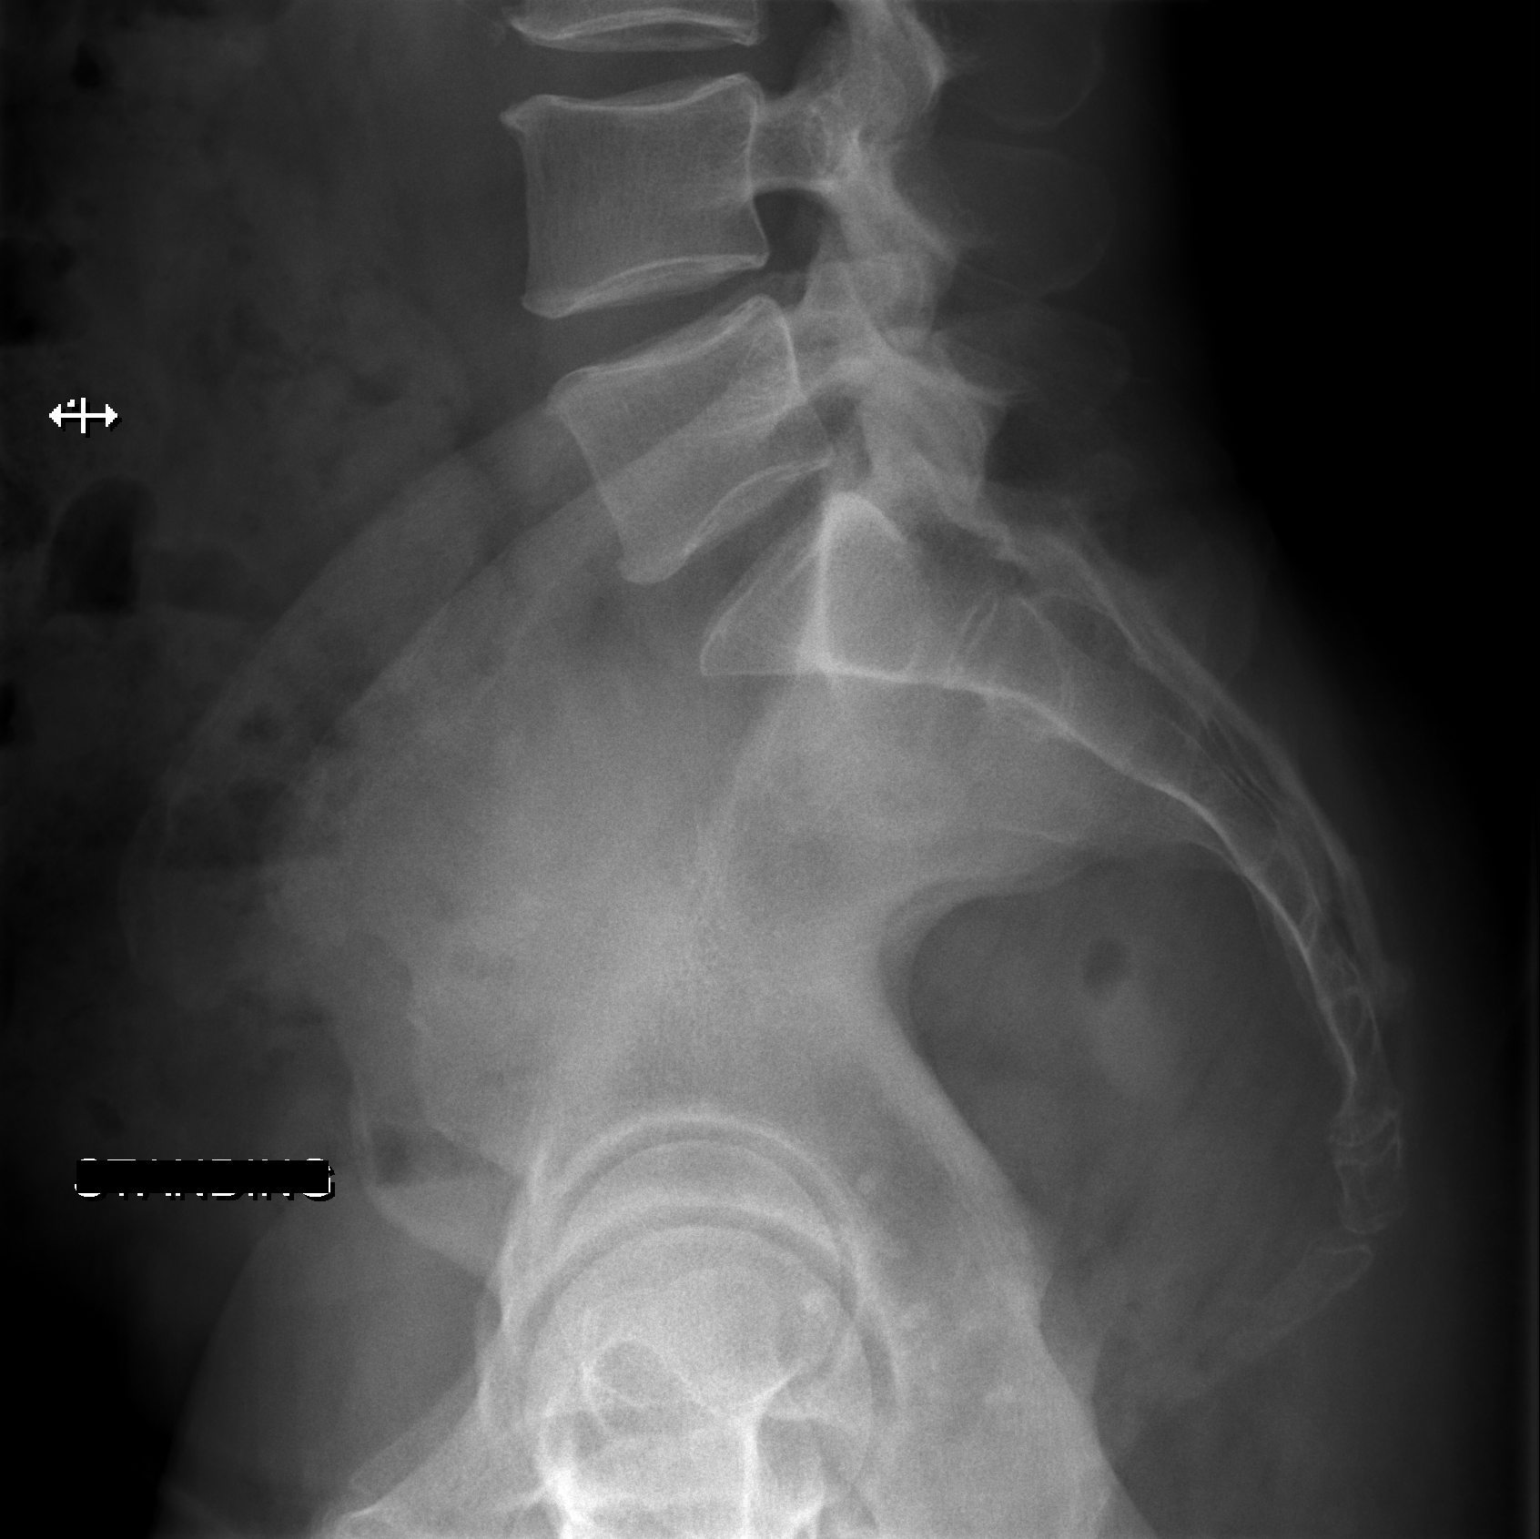

[3 of 3 positions shown; findings below may reference images not displayed]

FINDINGS: Previous cholecystectomy.  Numerous phleboliths in the pelvis.

Mild curvature of the lumbar spine, apex to the right. No other
malalignment. Multilevel degenerative disc disease with small
anterior osteophytes. No fractures are identified. No other
significant abnormalities are noted.
IMPRESSION: 1. No fracture or malalignment.
2. Mild multilevel degenerative disc disease.

## 2023-06-14 DIAGNOSIS — M542 Cervicalgia: Secondary | ICD-10-CM | POA: Diagnosis not present

## 2023-06-15 ENCOUNTER — Other Ambulatory Visit (HOSPITAL_BASED_OUTPATIENT_CLINIC_OR_DEPARTMENT_OTHER): Payer: Self-pay

## 2023-06-18 ENCOUNTER — Other Ambulatory Visit (HOSPITAL_BASED_OUTPATIENT_CLINIC_OR_DEPARTMENT_OTHER): Payer: Self-pay

## 2023-06-18 ENCOUNTER — Other Ambulatory Visit: Payer: Self-pay

## 2023-06-18 MED ORDER — TIMOLOL MALEATE 0.5 % OP SOLN
1.0000 [drp] | Freq: Two times a day (BID) | OPHTHALMIC | 4 refills | Status: AC
  Filled 2023-06-18: qty 5, 25d supply, fill #0
  Filled 2023-06-18: qty 5, 75d supply, fill #0
  Filled 2023-08-20 – 2023-08-27 (×2): qty 10, 100d supply, fill #1
  Filled 2023-11-13: qty 10, 100d supply, fill #2
  Filled 2024-01-10: qty 10, 100d supply, fill #3

## 2023-06-25 DIAGNOSIS — M542 Cervicalgia: Secondary | ICD-10-CM | POA: Diagnosis not present

## 2023-06-27 DIAGNOSIS — M542 Cervicalgia: Secondary | ICD-10-CM | POA: Diagnosis not present

## 2023-06-28 DIAGNOSIS — H401131 Primary open-angle glaucoma, bilateral, mild stage: Secondary | ICD-10-CM | POA: Diagnosis not present

## 2023-07-02 DIAGNOSIS — M542 Cervicalgia: Secondary | ICD-10-CM | POA: Diagnosis not present

## 2023-07-04 DIAGNOSIS — M542 Cervicalgia: Secondary | ICD-10-CM | POA: Diagnosis not present

## 2023-07-16 DIAGNOSIS — M542 Cervicalgia: Secondary | ICD-10-CM | POA: Diagnosis not present

## 2023-07-18 DIAGNOSIS — G8918 Other acute postprocedural pain: Secondary | ICD-10-CM | POA: Diagnosis not present

## 2023-07-30 ENCOUNTER — Other Ambulatory Visit (HOSPITAL_BASED_OUTPATIENT_CLINIC_OR_DEPARTMENT_OTHER): Payer: Self-pay

## 2023-07-30 MED ORDER — GOLYTELY 236 G PO SOLR
ORAL | 0 refills | Status: DC
  Filled 2023-07-30: qty 4000, 1d supply, fill #0

## 2023-08-02 DIAGNOSIS — R202 Paresthesia of skin: Secondary | ICD-10-CM | POA: Diagnosis not present

## 2023-08-02 DIAGNOSIS — R1013 Epigastric pain: Secondary | ICD-10-CM | POA: Diagnosis not present

## 2023-08-02 DIAGNOSIS — R5383 Other fatigue: Secondary | ICD-10-CM | POA: Diagnosis not present

## 2023-08-02 DIAGNOSIS — E7800 Pure hypercholesterolemia, unspecified: Secondary | ICD-10-CM | POA: Diagnosis not present

## 2023-08-02 DIAGNOSIS — E559 Vitamin D deficiency, unspecified: Secondary | ICD-10-CM | POA: Diagnosis not present

## 2023-08-02 DIAGNOSIS — M2560 Stiffness of unspecified joint, not elsewhere classified: Secondary | ICD-10-CM | POA: Diagnosis not present

## 2023-08-03 ENCOUNTER — Other Ambulatory Visit: Payer: Self-pay | Admitting: Family Medicine

## 2023-08-03 DIAGNOSIS — R1013 Epigastric pain: Secondary | ICD-10-CM

## 2023-08-10 DIAGNOSIS — Z860100 Personal history of colon polyps, unspecified: Secondary | ICD-10-CM | POA: Diagnosis not present

## 2023-08-10 DIAGNOSIS — Z1211 Encounter for screening for malignant neoplasm of colon: Secondary | ICD-10-CM | POA: Diagnosis not present

## 2023-08-10 DIAGNOSIS — K648 Other hemorrhoids: Secondary | ICD-10-CM | POA: Diagnosis not present

## 2023-08-10 DIAGNOSIS — K5730 Diverticulosis of large intestine without perforation or abscess without bleeding: Secondary | ICD-10-CM | POA: Diagnosis not present

## 2023-08-14 ENCOUNTER — Ambulatory Visit
Admission: RE | Admit: 2023-08-14 | Discharge: 2023-08-14 | Disposition: A | Discharge status: Home / Self Care | Source: Ambulatory Visit | Attending: Family Medicine | Admitting: Family Medicine

## 2023-08-14 DIAGNOSIS — K7689 Other specified diseases of liver: Secondary | ICD-10-CM | POA: Diagnosis not present

## 2023-08-14 DIAGNOSIS — R1013 Epigastric pain: Secondary | ICD-10-CM

## 2023-08-14 MED ORDER — IOPAMIDOL (ISOVUE-300) INJECTION 61%
100.0000 mL | Freq: Once | INTRAVENOUS | Status: AC | PRN
  Administered 2023-08-14 (×2): 100 mL via INTRAVENOUS

## 2023-08-20 ENCOUNTER — Other Ambulatory Visit (HOSPITAL_BASED_OUTPATIENT_CLINIC_OR_DEPARTMENT_OTHER): Payer: Self-pay

## 2023-09-06 ENCOUNTER — Telehealth: Payer: Self-pay | Admitting: Neurology

## 2023-09-06 NOTE — Telephone Encounter (Signed)
 Patient called to check for a earlier appointment.  Patient ask for username and send link to email to change password for MyChart.

## 2023-10-09 DIAGNOSIS — Z981 Arthrodesis status: Secondary | ICD-10-CM | POA: Diagnosis not present

## 2023-10-09 DIAGNOSIS — Z4889 Encounter for other specified surgical aftercare: Secondary | ICD-10-CM | POA: Diagnosis not present

## 2023-10-18 DIAGNOSIS — Z860100 Personal history of colon polyps, unspecified: Secondary | ICD-10-CM | POA: Diagnosis not present

## 2023-10-18 DIAGNOSIS — K5730 Diverticulosis of large intestine without perforation or abscess without bleeding: Secondary | ICD-10-CM | POA: Diagnosis not present

## 2023-10-18 DIAGNOSIS — R1012 Left upper quadrant pain: Secondary | ICD-10-CM | POA: Diagnosis not present

## 2023-12-08 ENCOUNTER — Ambulatory Visit
Admission: EM | Admit: 2023-12-08 | Discharge: 2023-12-08 | Disposition: A | Discharge status: Home / Self Care | Attending: Family Medicine | Admitting: Family Medicine

## 2023-12-08 ENCOUNTER — Other Ambulatory Visit: Payer: Self-pay

## 2023-12-08 DIAGNOSIS — J019 Acute sinusitis, unspecified: Secondary | ICD-10-CM

## 2023-12-08 DIAGNOSIS — J209 Acute bronchitis, unspecified: Secondary | ICD-10-CM | POA: Diagnosis not present

## 2023-12-08 MED ORDER — PREDNISONE 20 MG PO TABS: 40.0000 mg | ORAL_TABLET | Freq: Every day | ORAL | 0 refills | Status: AC

## 2023-12-08 MED ORDER — AMOXICILLIN-POT CLAVULANATE 875-125 MG PO TABS: 1.0000 | ORAL_TABLET | Freq: Two times a day (BID) | ORAL | 0 refills | Status: AC

## 2023-12-08 NOTE — Discharge Instructions (Signed)
 Take Augmentin  2 times a day.  This is a strong antibiotic, consider taking a probiotic with it to protect your stomach Take prednisone  once a day for 5 days Make sure you are drinking lots of water. May continue over-the-counter cough and cold medicines Call for problems

## 2023-12-08 NOTE — ED Provider Notes (Signed)
 [facility]    CSN: [id number] Arrival date & time: 12/08/23  0803      History   Chief Complaint Chief Complaint  Patient presents with   Cough    HPI Angie Fisher is a 68 y.o. female.   Patient has been sick for 8 to 9 days feels like she is not improving.  She has sinus congestion, green-brown drainage, postnasal drip, headache, cough, chest congestion.  Thick mucus.  Feels like she is not getting better.  Has tried over-the-counter medications.  Non-smoker.  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   CHRONIC ANGLE-CLOSURE GLAUCOMA 09/27/2009   RECTAL BLEEDING 09/27/2009   History of colonic polyps 09/27/2009    Past Surgical History:  Procedure Laterality Date   APPENDECTOMY     CHOLECYSTECTOMY     HERNIA REPAIR     PARTIAL HYSTERECTOMY     SHOULDER ARTHROSCOPY WITH ROTATOR CUFF REPAIR AND SUBACROMIAL DECOMPRESSION Right 06/14/2022   Procedure: RIGHT SHOULDER ARTHROSCOPY WITH MINI OPEN ROTATOR CUFF REPAIR AND SUBACROMIAL DECOMPRESSION;  Surgeon: Duwayne Purchase, MD;  Location: [facility];  Service: Orthopedics;  Laterality: Right;  90 MINS   SKIN CANCER EXCISION      OB History   No obstetric history on file.      Home Medications    Prior to Admission medications   Medication Sig Start Date End Date Taking? Authorizing Provider  amoxicillin -clavulanate (AUGMENTIN ) 875-125 MG tablet Take 1 tablet by mouth every 12 (twelve) hours. 12/08/23  Yes Maranda Jamee Jacob, MD  predniSONE  (DELTASONE ) 20 MG tablet Take 2 tablets (40 mg total) by mouth daily with breakfast. 12/08/23  Yes Maranda Jamee Jacob, MD  Cholecalciferol (VITAMIN D3) 50 MCG (2000 UT) TABS Take 2,000 mcg by mouth daily. 01/02/13   [provider]   Simvastatin  20 MG/5ML SUSP Simvastatin     [provider]  timolol  (TIMOPTIC ) 0.5 % ophthalmic solution INSTILL 1 DROP INTO BOTH EYES TWICE A DAY 02/09/21     timolol  (TIMOPTIC ) 0.5 % ophthalmic solution Place 1 drop into both eyes 2 (two) times daily. 06/18/23       Family History Family History  Problem Relation Age of Onset   Hypertension Mother    Diabetes Mother    Dementia Mother    Glaucoma Mother    Bone cancer Father     Social History Social History   Tobacco Use   Smoking status: Former    Current packs/day: 0.25    Average packs/day: 0.3 packs/day for 13.0 years (3.3 ttl pk-yrs)    Types: Cigarettes   Smokeless tobacco: Never  Vaping Use   Vaping status: Never Used  Substance Use Topics   Alcohol use: Yes    Comment: social   Drug use: Never     Allergies   Lidocaine , Sulfonamide derivatives, and Zoster vac recomb adjuvanted   Review of Systems Review of Systems  See HPI Physical Exam Triage Vital Signs ED Triage Vitals  Encounter Vitals Group     BP 12/08/23 0825 124/85     Girls Systolic BP Percentile --      Girls Diastolic BP Percentile --  Boys Systolic BP Percentile --      Boys Diastolic BP Percentile --      Pulse Rate 12/08/23 0825 96     Resp 12/08/23 0825 18     Temp 12/08/23 0825 98.1 F (36.7 C)     Temp Source 12/08/23 0825 Oral     SpO2 12/08/23 0825 97 %     Weight 12/08/23 0828 144 lb (65.3 kg)     Height 12/08/23 0828 5' 4 (1.626 m)     Head Circumference --      Peak Flow --      Pain Score 12/08/23 0828 8     Pain Loc --      Pain Education --      Exclude from Growth Chart --    No data found.  Updated Vital Signs BP 124/85 (BP Location: Right Arm)   Pulse 96   Temp 98.1 F (36.7 C) (Oral)   Resp 18   Ht 5' 4 (1.626 m)   Wt 65.3 kg   SpO2 97%   BMI 24.72 kg/m      Physical Exam Constitutional:      General: She is not in acute distress.    Appearance: She is well-developed. She is  ill-appearing.  HENT:     Head: Normocephalic and atraumatic.     Right Ear: Tympanic membrane normal.     Left Ear: Tympanic membrane normal.     Nose: Congestion and rhinorrhea present.     Mouth/Throat:     Pharynx: Posterior oropharyngeal erythema present.     Comments: Facial sinuses very tender Eyes:     Conjunctiva/sclera: Conjunctivae normal.     Pupils: Pupils are equal, round, and reactive to light.  Cardiovascular:     Rate and Rhythm: Normal rate and regular rhythm.     Heart sounds: Normal heart sounds.  Pulmonary:     Effort: Pulmonary effort is normal. No respiratory distress.     Breath sounds: Normal breath sounds.  Musculoskeletal:        General: Normal range of motion.     Cervical back: Normal range of motion.  Lymphadenopathy:     Cervical: No cervical adenopathy.  Skin:    General: Skin is warm and dry.  Neurological:     Mental Status: She is alert.      UC Treatments / Results  Labs (all labs ordered are listed, but only abnormal results are displayed) Labs Reviewed - No data to display  EKG   Radiology No results found.  Procedures Procedures (including critical care time)  Medications Ordered in UC Medications - No data to display  Initial Impression / Assessment and Plan / UC Course  I have reviewed the triage vital signs and the nursing notes.  Pertinent labs & imaging results that were available during my care of the patient were reviewed by me and considered in my medical decision making (see chart for details).     Persistent sinusitis after appropriate home treatment.  Will cover with antibiotics.  Prednisone  for the congestion.  Push fluids.  Return as needed Final Clinical Impressions(s) / UC Diagnoses   Final diagnoses:  Acute sinusitis with symptoms greater than 10 days  Acute bronchitis, unspecified organism     Discharge Instructions      Take Augmentin  2 times a day.  This is a strong antibiotic, consider  taking a probiotic with it to protect your stomach Take prednisone  once a day for 5  days Make sure you are drinking lots of water. May continue over-the-counter cough and cold medicines Call for problems   ED Prescriptions     Medication Sig Dispense Auth. Provider   amoxicillin -clavulanate (AUGMENTIN ) 875-125 MG tablet Take 1 tablet by mouth every 12 (twelve) hours. 14 tablet Maranda Jamee Jacob, MD   predniSONE  (DELTASONE ) 20 MG tablet Take 2 tablets (40 mg total) by mouth daily with breakfast. 10 tablet Maranda Jamee Jacob, MD      PDMP not reviewed this encounter.   Maranda Jamee Jacob, MD 12/08/23 [id number]

## 2023-12-08 NOTE — ED Triage Notes (Addendum)
 Pt presenting with c/o productive cough with greeny brown  sputum, headache, runny nose, body aches and chest congestion x 6 days. Pt stated that she has been using Mucinex, last dose last night with minimal effectiveness.

## 2023-12-09 ENCOUNTER — Telehealth: Payer: Self-pay

## 2024-01-04 ENCOUNTER — Other Ambulatory Visit (HOSPITAL_BASED_OUTPATIENT_CLINIC_OR_DEPARTMENT_OTHER): Payer: Self-pay

## 2024-01-07 ENCOUNTER — Other Ambulatory Visit (HOSPITAL_BASED_OUTPATIENT_CLINIC_OR_DEPARTMENT_OTHER): Payer: Self-pay

## 2024-01-07 ENCOUNTER — Encounter: Payer: Self-pay | Admitting: Neurology

## 2024-01-07 ENCOUNTER — Ambulatory Visit: Payer: Medicare (Managed Care) | Admitting: Neurology

## 2024-01-07 VITALS — BP 125/76 | HR 75 | Ht 64.0 in | Wt 148.6 lb

## 2024-01-07 DIAGNOSIS — R202 Paresthesia of skin: Secondary | ICD-10-CM | POA: Diagnosis not present

## 2024-01-07 DIAGNOSIS — R269 Unspecified abnormalities of gait and mobility: Secondary | ICD-10-CM | POA: Insufficient documentation

## 2024-01-07 MED ORDER — SIMVASTATIN 40 MG PO TABS
40.0000 mg | ORAL_TABLET | Freq: Every day | ORAL | 1 refills | Status: AC
  Filled 2024-01-07: qty 90, 90d supply, fill #0

## 2024-01-07 NOTE — Progress Notes (Signed)
 "  Chief Complaint  Patient presents with   New Patient (Initial Visit)    Pt in room 14.alone. Paper referral ongoing tingling in LE.   ASSESSMENT AND PLAN  Angie Fisher is a 69 y.o. female   Intermittent bilateral heel paresthesia  Essentially normal neurological examination, functioning well,  Laboratory evaluation to rule out small fiber neuropathy, differentiation diagnosis also include muscular skeleton/fascial etiology  Suggested continue moderate exercise, frequent warm compression, stretching  DIAGNOSTIC DATA (LABS, IMAGING, TESTING) - I reviewed patient records, labs, notes, testing and imaging myself where available.   MEDICAL HISTORY:  Angie Fisher, is a 69 year old female seen in request by her primary care from [facility], [facility], for evaluation of intermittent bilateral heel paresthesia  History is obtained from the patient and review of electronic medical records. I personally reviewed pertinent available imaging films in PACS.   PMHx of  HLD Glaucoma  Right shoulder pain, hx of rotator cuff surgery in June 2024, Neck fusion in April 2025, presented with right cervical radiculopathy, improved after surgery  She had a history of cervical decompression surgery in April 2025, presented with right cervical radiculopathy, neck pain, surgery did resolve her problem  Before that, end of 2024 she noticed intermittent bilateral heel paresthesia, most noticeable when she first get up from overnight sleep, painful heels when bearing weight, more on the left side, if she take few steps, her symptoms will quickly improve  She was able to exercise and walking 4 miles regularly without any difficulties, has urinary urgency, intermittent low back pain radiating pain to left lower extremity,  Continue to have right shoulder pain limited range of motion following her right rotator cuff surgery in June 2024   Laboratory evaluation from [facility] her primary care July 2025,  vitamin D 47.5, normal TSH 1.29, B12 low normal range 373, lipase 36, normal iron panel, CMP creatinine 0.63, CBC with hemoglobin of 14.8, A1c 5.2,  X-ray from [facility] February 2023, mild multilevel degenerative disease  PHYSICAL EXAM:   Vitals:   01/07/24 0916  BP: 125/76  Pulse: 75  SpO2: 98%  Weight: 148 lb 9.6 oz (67.4 kg)  Height: 5' 4 (1.626 m)   Not recorded     Body mass index is 25.51 kg/m.  PHYSICAL EXAMNIATION:  Gen: NAD, conversant, well nourised, well groomed                     Cardiovascular: Regular rate rhythm, no peripheral edema, warm, nontender. Eyes: Conjunctivae clear without exudates or hemorrhage Neck: Supple, no carotid bruits. Pulmonary: Clear to auscultation bilaterally   NEUROLOGICAL EXAM:  MENTAL STATUS: Speech/cognition: Awake, alert, oriented to history taking and casual conversation CRANIAL NERVES: CN II: Visual fields are full to confrontation. Pupils are round equal and briskly reactive to light. CN III, IV, VI: extraocular movement are normal. No ptosis. CN V: Facial sensation is intact to light touch CN VII: Face is symmetric with normal eye closure  CN VIII: Hearing is normal to causal conversation. CN IX, X: Phonation is normal. CN XI: Head turning and shoulder shrug are intact  MOTOR: There is no pronator drift of out-stretched arms. Muscle bulk and tone are normal. Muscle strength is normal.  REFLEXES: Reflexes are 2+ and symmetric at the biceps, triceps, knees, and ankles. Plantar responses are flexor.  SENSORY: Intact to light touch, pinprick and vibratory sensation are intact in fingers and toes.  COORDINATION: There is no trunk or limb dysmetria  noted.  GAIT/STANCE: Posture is normal. Gait is steady with normal steps, base, arm swing, and turning. Heel and toe walking are normal. Tandem gait is normal.  Romberg is absent.  REVIEW OF SYSTEMS:  Full 14 system review of systems performed and notable only  for as above All other review of systems were negative.   ALLERGIES: Allergies[1]  HOME MEDICATIONS: Current Outpatient Medications  Medication Sig Dispense Refill   Cholecalciferol (VITAMIN D3) 50 MCG (2000 UT) TABS Take 2,000 mcg by mouth daily.     Cyanocobalamin  (VITAMIN B 12 PO) Vitamin B 12     Simvastatin  20 MG/5ML SUSP Simvastatin      timolol  (TIMOPTIC ) 0.5 % ophthalmic solution Place 1 drop into both eyes 2 (two) times daily. 10 mL 4   Vitamin Mixture (VITAMIN E COMPLETE PO) Take by mouth.     amoxicillin -clavulanate (AUGMENTIN ) 875-125 MG tablet Take 1 tablet by mouth every 12 (twelve) hours. (Patient not taking: Reported on 01/07/2024) 14 tablet 0   predniSONE  (DELTASONE ) 20 MG tablet Take 2 tablets (40 mg total) by mouth daily with breakfast. (Patient not taking: Reported on 01/07/2024) 10 tablet 0   timolol  (TIMOPTIC ) 0.5 % ophthalmic solution INSTILL 1 DROP INTO BOTH EYES TWICE A DAY (Patient not taking: Reported on 01/07/2024) 10 mL 4   No current facility-administered medications for this visit.    PAST MEDICAL HISTORY: Past Medical History:  Diagnosis Date   Anxiety    Arthritis    Colon polyp    Glaucoma    History of melanoma    Hyperlipidemia    Vitamin D deficiency     PAST SURGICAL HISTORY: Past Surgical History:  Procedure Laterality Date   APPENDECTOMY     CHOLECYSTECTOMY     HERNIA REPAIR     PARTIAL HYSTERECTOMY     SHOULDER ARTHROSCOPY WITH ROTATOR CUFF REPAIR AND SUBACROMIAL DECOMPRESSION Right 06/14/2022   Procedure: RIGHT SHOULDER ARTHROSCOPY WITH MINI OPEN ROTATOR CUFF REPAIR AND SUBACROMIAL DECOMPRESSION;  Surgeon: Duwayne Purchase, MD;  Location: [facility];  Service: Orthopedics;  Laterality: Right;  90 MINS   SKIN CANCER EXCISION      FAMILY HISTORY: Family History  Problem Relation Age of Onset   Hypertension Mother    Diabetes Mother    Dementia Mother    Glaucoma Mother    Bone cancer Father     SOCIAL HISTORY: Social History    Socioeconomic History   Marital status: Married    Spouse name: Not on file   Number of children: 2   Years of education: 12   Highest education level: High school graduate  Occupational History   Occupation: Retired  Tobacco Use   Smoking status: Former    Current packs/day: 0.25    Average packs/day: 0.3 packs/day for 13.0 years (3.3 ttl pk-yrs)    Types: Cigarettes   Smokeless tobacco: Never  Vaping Use   Vaping status: Never Used  Substance and Sexual Activity   Alcohol use: Not Currently    Comment: social   Drug use: Never   Sexual activity: Not on file  Other Topics Concern   Not on file  Social History Narrative   Lives at home with her husband.   1-2 cups caffeine per day.   Right-handed.   Social Drivers of Health   Tobacco Use: Medium Risk (12/08/2023)   Patient History    Smoking Tobacco Use: Former    Smokeless Tobacco Use: Never    Passive Exposure: Not on file  Financial Resource Strain: Not on file  Food Insecurity: Low Risk (08/07/2022)   Received from [facility]   Epic    Within the past 12 months, you worried that your food would run out before you got money to buy more: Never true    Within the past 12 months, the food you bought just didn't last and you didn't have money to get more. : Never true  Transportation Needs: No Transportation Needs (08/07/2022)   Received from [facility]    In the past 12 months, has lack of reliable transportation kept you from medical appointments, meetings, work or from getting things needed for daily living? : No  Physical Activity: Not on file  Stress: Not on file  Social Connections: Not on file  Intimate Partner Violence: Not on file  Depression (EYV7-0): Not on file  Alcohol Screen: Not on file  Housing: Low Risk (08/07/2022)   Received from [facility]   Epic    What is your living situation today?: I have a steady place to live    Think about the place you live. Do you have problems  with any of the following? Choose all that apply:: Not on file  Utilities: Low Risk (08/07/2022)   Received from [facility]   Utilities    In the past 12 months has the electric, gas, oil, or water company threatened to shut off services in your home? : No  Health Literacy: Not on file      Modena Callander, M.D. Ph.D.  [facility] [address], [address] [place], [place] [address] Ph: [phone] Fax: [phone]  CC:  Katina Pfeiffer, PA-C [address] [place],  [place] [address]  Katina Pfeiffer, PA-C      [1]  Allergies Allergen Reactions   Lidocaine  Hives and Rash    Per patient topical form only.    Sulfonamide Derivatives     REACTION: Nausea/vomiting   Zoster Vac Recomb Adjuvanted     Other reaction(s): increase HR, arm numbness   "

## 2024-01-09 LAB — MULTIPLE MYELOMA PANEL, SERUM
Albumin SerPl Elph-Mcnc: 4.1 g/dL (ref 2.9–4.4)
Albumin/Glob SerPl: 1.5 (ref 0.7–1.7)
Alpha 1: 0.2 g/dL (ref 0.0–0.4)
Alpha2 Glob SerPl Elph-Mcnc: 0.8 g/dL (ref 0.4–1.0)
B-Globulin SerPl Elph-Mcnc: 0.9 g/dL (ref 0.7–1.3)
Gamma Glob SerPl Elph-Mcnc: 0.8 g/dL (ref 0.4–1.8)
Globulin, Total: 2.8 g/dL (ref 2.2–3.9)
IgG (Immunoglobin G), Serum: 774 mg/dL (ref 586–1602)
IgM (Immunoglobulin M), Srm: 112 mg/dL (ref 26–217)
Immunoglobulin A, (IgA) QN, Serum: 88 mg/dL (ref 87–352)
Total Protein: 6.9 g/dL (ref 6.0–8.5)

## 2024-01-09 LAB — ANA W/REFLEX IF POSITIVE: Anti Nuclear Antibody (ANA): NEGATIVE

## 2024-01-09 LAB — SEDIMENTATION RATE: Sed Rate: 2 mm/h (ref 0–40)

## 2024-01-09 LAB — C-REACTIVE PROTEIN: CRP: <1 mg/L (ref 0–10)

## 2024-01-09 LAB — SYPHILIS: RPR W/REFLEX TO RPR TITER AND TREPONEMAL ANTIBODIES, TRADITIONAL SCREENING AND DIAGNOSIS ALGORITHM: RPR Ser Ql: NONREACTIVE

## 2024-01-09 LAB — CK: Total CK: 58 U/L (ref 32–182)

## 2024-01-10 ENCOUNTER — Other Ambulatory Visit (HOSPITAL_BASED_OUTPATIENT_CLINIC_OR_DEPARTMENT_OTHER): Payer: Self-pay

## 2024-01-10 MED ORDER — FLUZONE HIGH-DOSE 0.5 ML IM SUSY
0.5000 mL | PREFILLED_SYRINGE | Freq: Once | INTRAMUSCULAR | 0 refills | Status: AC
  Filled 2024-01-10: qty 0.5, 1d supply, fill #0

## 2024-01-15 ENCOUNTER — Ambulatory Visit: Payer: Self-pay | Admitting: Neurology

## 2024-01-21 ENCOUNTER — Ambulatory Visit: Admitting: Family Medicine
